# Patient Record
Sex: Female | Born: 1990 | Race: Black or African American | Hispanic: No | Marital: Single | Smoking: Never smoker
Health system: Southern US, Community
[De-identification: ages and names within clinical notes are randomized; demographics above are authoritative.]

## PROBLEM LIST (undated history)

## (undated) DIAGNOSIS — I10 Essential (primary) hypertension: Secondary | ICD-10-CM

---
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
## 2010-03-27 ENCOUNTER — Emergency Department (HOSPITAL_COMMUNITY)
Admission: EM | Admit: 2010-03-27 | Discharge: 2010-03-27 | Payer: Self-pay | Source: Home / Self Care | Admitting: Family Medicine

## 2010-08-22 ENCOUNTER — Emergency Department (HOSPITAL_COMMUNITY): Admission: EM | Admit: 2010-08-22 | Discharge: 2010-08-23 | Payer: Self-pay | Admitting: Emergency Medicine

## 2010-09-24 IMAGING — CR DG ELBOW COMPLETE 3+V*L*
4 series · 4 of 4 positions shown · non-contrast
Comparison: None.

CLINICAL DATA: Fall, pain.

LEFT ELBOW - COMPLETE 3+ VIEW

[x elbow joint ap left *]
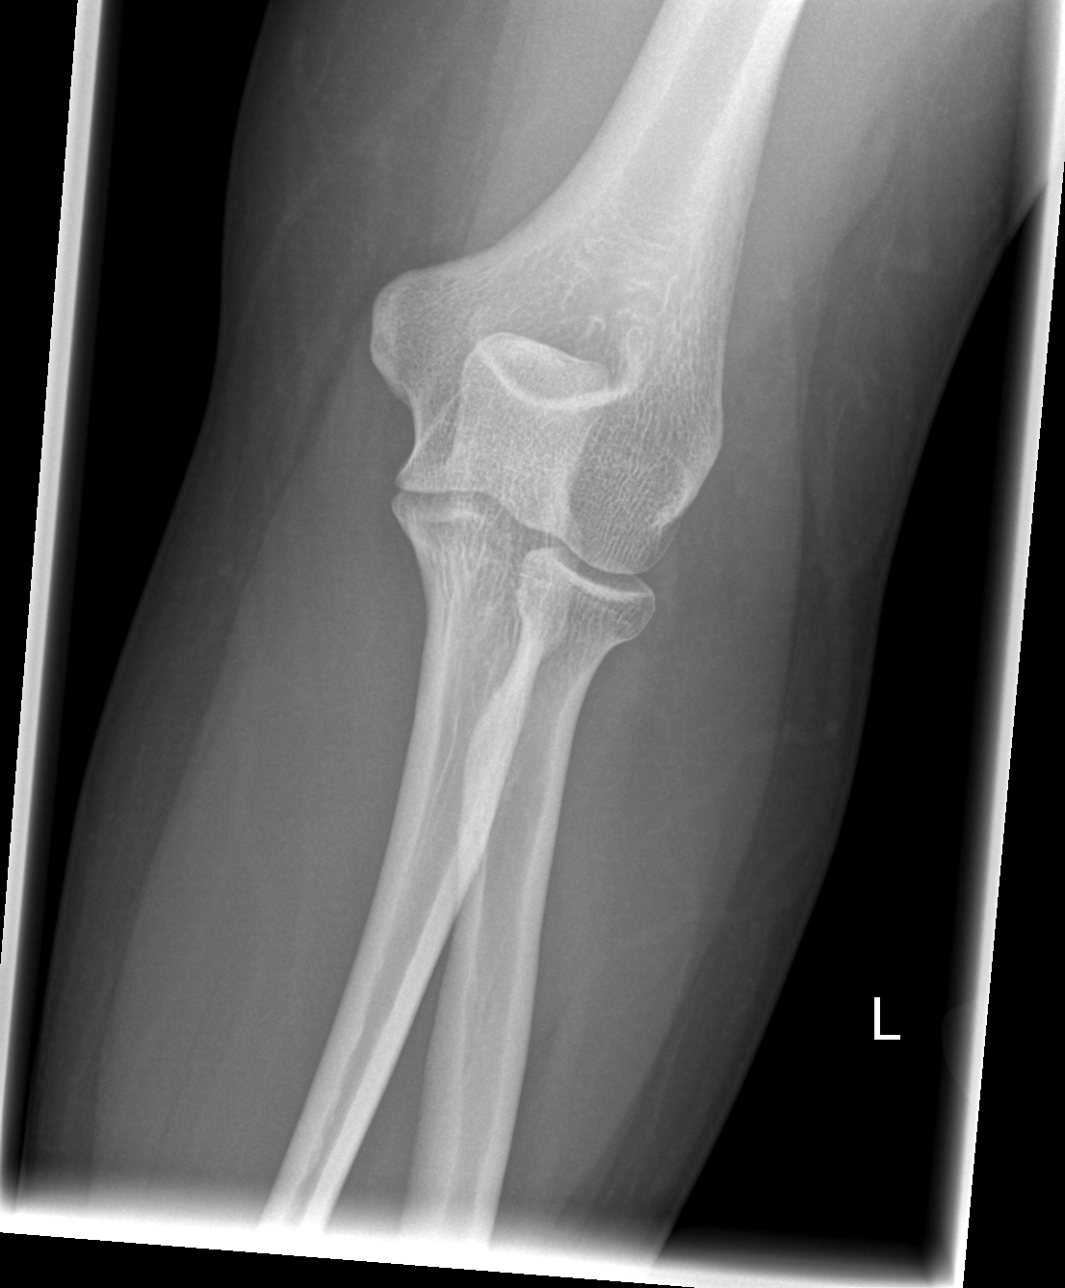

[x elbow joint obl. left * (1 of 2)]
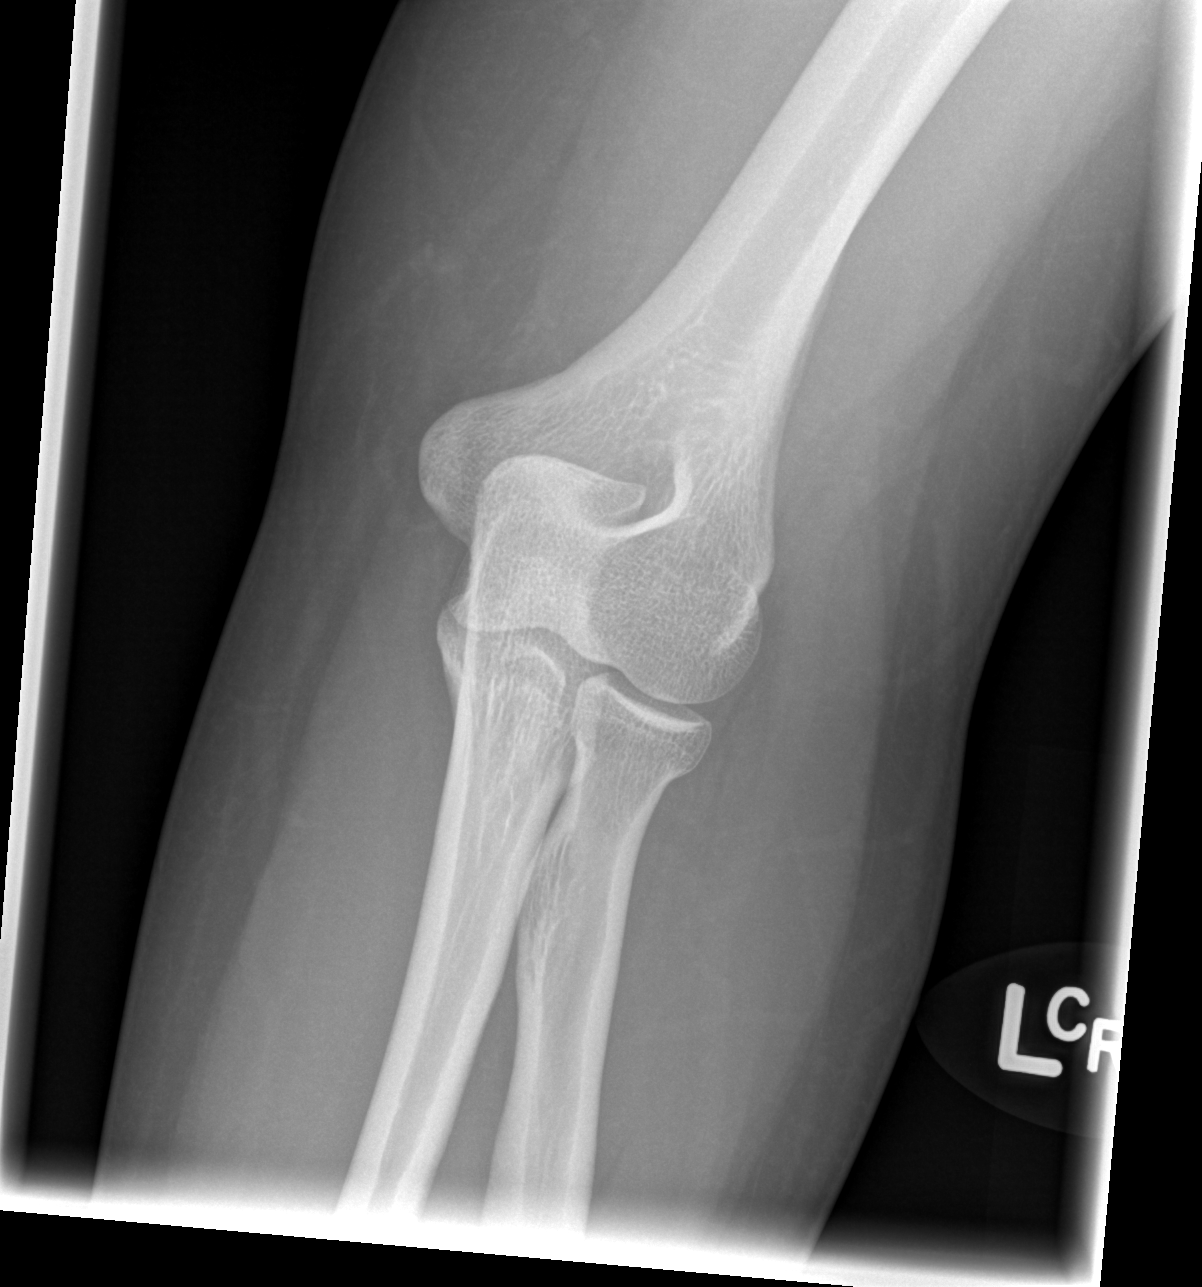

[x elbow joint obl. left * (2 of 2)]
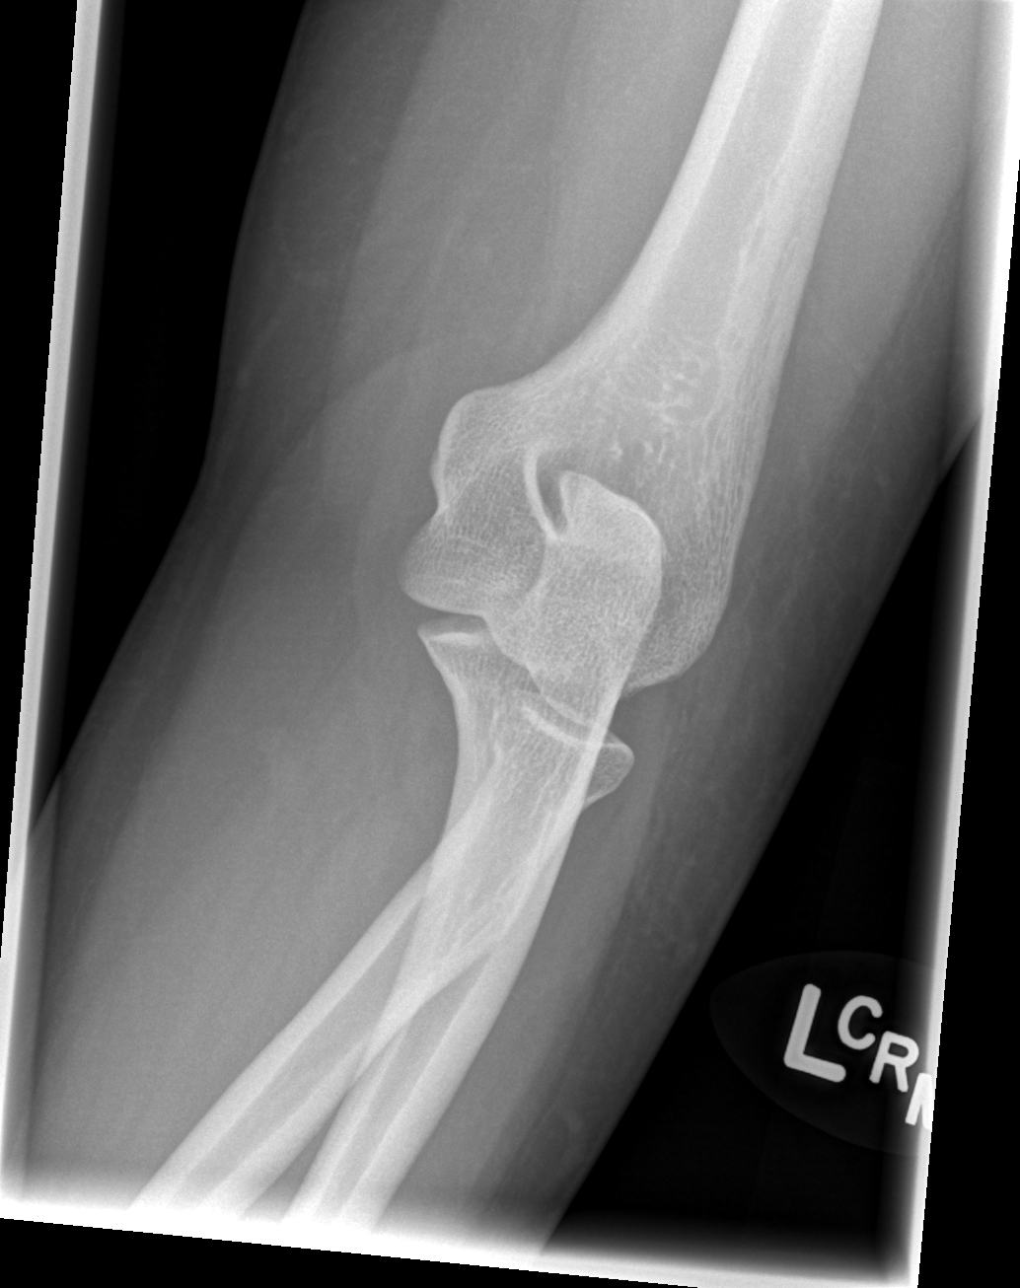

[x elbow joint lat left *]
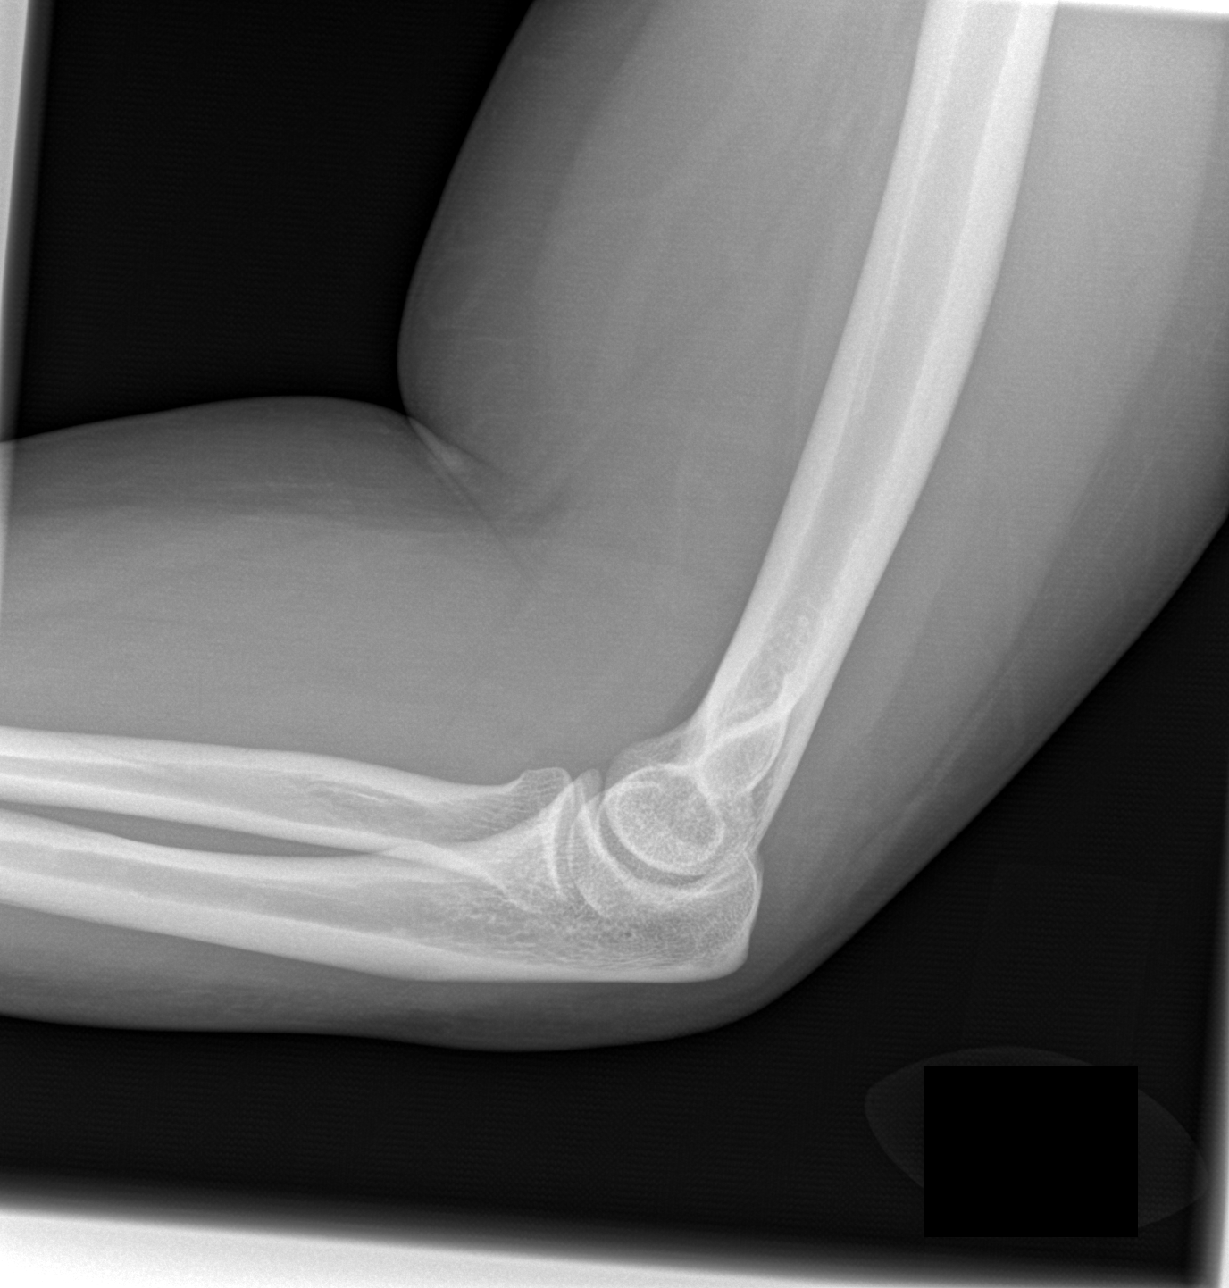

[4 of 4 positions shown; findings below may reference images not displayed]

FINDINGS: Imaged bones, joints and soft tissues appear normal.
IMPRESSION: Negative exam.

## 2011-01-09 ENCOUNTER — Emergency Department (HOSPITAL_COMMUNITY)
Admission: EM | Admit: 2011-01-09 | Discharge: 2011-01-09 | Payer: Self-pay | Source: Home / Self Care | Admitting: Emergency Medicine

## 2011-01-30 ENCOUNTER — Inpatient Hospital Stay (HOSPITAL_COMMUNITY)
Admission: AD | Admit: 2011-01-30 | Discharge: 2011-01-30 | Disposition: A | Discharge status: Home / Self Care | Payer: Medicaid Other | Source: Ambulatory Visit | Attending: Obstetrics | Admitting: Obstetrics

## 2011-01-30 ENCOUNTER — Inpatient Hospital Stay (HOSPITAL_COMMUNITY): Payer: Medicaid Other

## 2011-01-30 DIAGNOSIS — N838 Other noninflammatory disorders of ovary, fallopian tube and broad ligament: Secondary | ICD-10-CM | POA: Insufficient documentation

## 2011-01-30 DIAGNOSIS — N898 Other specified noninflammatory disorders of vagina: Secondary | ICD-10-CM

## 2011-01-30 DIAGNOSIS — N949 Unspecified condition associated with female genital organs and menstrual cycle: Secondary | ICD-10-CM | POA: Insufficient documentation

## 2011-01-30 DIAGNOSIS — Z30431 Encounter for routine checking of intrauterine contraceptive device: Secondary | ICD-10-CM | POA: Insufficient documentation

## 2011-01-30 LAB — URINALYSIS, ROUTINE W REFLEX MICROSCOPIC
Ketones, ur: NEGATIVE mg/dL
Leukocytes, UA: NEGATIVE
Nitrite: NEGATIVE
Protein, ur: NEGATIVE mg/dL

## 2011-01-30 LAB — WET PREP, GENITAL: Trich, Wet Prep: NONE SEEN

## 2011-01-30 LAB — URINE MICROSCOPIC-ADD ON

## 2011-01-31 LAB — GC/CHLAMYDIA PROBE AMP, GENITAL: Chlamydia, DNA Probe: NEGATIVE

## 2011-03-08 ENCOUNTER — Other Ambulatory Visit (HOSPITAL_COMMUNITY): Payer: Self-pay | Admitting: Obstetrics

## 2011-03-08 DIAGNOSIS — O3680X0 Pregnancy with inconclusive fetal viability, not applicable or unspecified: Secondary | ICD-10-CM

## 2011-03-08 LAB — POCT I-STAT, CHEM 8
Calcium, Ion: 1.23 mmol/L (ref 1.12–1.32)
Chloride: 103 mEq/L (ref 96–112)
Glucose, Bld: 83 mg/dL (ref 70–99)
HCT: 37 % (ref 36.0–46.0)

## 2011-03-17 ENCOUNTER — Ambulatory Visit (HOSPITAL_COMMUNITY)
Admission: RE | Admit: 2011-03-17 | Discharge: 2011-03-17 | Disposition: A | Discharge status: Home / Self Care | Payer: Medicaid Other | Source: Ambulatory Visit | Attending: Obstetrics | Admitting: Obstetrics

## 2011-03-17 DIAGNOSIS — O3680X0 Pregnancy with inconclusive fetal viability, not applicable or unspecified: Secondary | ICD-10-CM

## 2011-03-17 DIAGNOSIS — Z3689 Encounter for other specified antenatal screening: Secondary | ICD-10-CM | POA: Insufficient documentation

## 2011-03-22 LAB — POCT URINALYSIS DIP (DEVICE)
Hgb urine dipstick: NEGATIVE
Protein, ur: NEGATIVE mg/dL
Specific Gravity, Urine: 1.02 (ref 1.005–1.030)
Urobilinogen, UA: 0.2 mg/dL (ref 0.0–1.0)
pH: 5 (ref 5.0–8.0)

## 2011-03-22 LAB — WET PREP, GENITAL: Yeast Wet Prep HPF POC: NONE SEEN

## 2011-03-22 LAB — POCT PREGNANCY, URINE: Preg Test, Ur: NEGATIVE

## 2011-03-22 LAB — GC/CHLAMYDIA PROBE AMP, GENITAL
Chlamydia, DNA Probe: NEGATIVE
GC Probe Amp, Genital: NEGATIVE

## 2011-03-24 LAB — DIFFERENTIAL
Eosinophils Absolute: 0.2 10*3/uL (ref 0.0–0.7)
Eosinophils Relative: 1 % (ref 0–5)
Lymphocytes Relative: 18 % (ref 12–46)
Lymphocytes Relative: 20 % (ref 12–46)
Lymphs Abs: 2.1 10*3/uL (ref 0.7–4.0)
Lymphs Abs: 2.5 10*3/uL (ref 0.7–4.0)
Monocytes Absolute: 0.8 10*3/uL (ref 0.1–1.0)
Monocytes Relative: 5 % (ref 3–12)
Monocytes Relative: 6 % (ref 3–12)
Neutro Abs: 8.4 10*3/uL — ABNORMAL HIGH (ref 1.7–7.7)
Neutrophils Relative %: 73 % (ref 43–77)

## 2011-03-24 LAB — CBC
HCT: 38.8 % (ref 36.0–46.0)
Hemoglobin: 12.9 g/dL (ref 12.0–15.0)
MCV: 82.3 fL (ref 78.0–100.0)
Platelets: 222 10*3/uL (ref 150–400)
RBC: 4.15 MIL/uL (ref 3.87–5.11)
WBC: 11.5 10*3/uL — ABNORMAL HIGH (ref 4.0–10.5)
WBC: 12.8 10*3/uL — ABNORMAL HIGH (ref 4.0–10.5)

## 2011-03-24 LAB — POCT I-STAT, CHEM 8
BUN: 5 mg/dL — ABNORMAL LOW (ref 6–23)
BUN: 8 mg/dL (ref 6–23)
Calcium, Ion: 1.19 mmol/L (ref 1.12–1.32)
Calcium, Ion: 1.22 mmol/L (ref 1.12–1.32)
Creatinine, Ser: 0.9 mg/dL (ref 0.4–1.2)
Glucose, Bld: 88 mg/dL (ref 70–99)
Glucose, Bld: 91 mg/dL (ref 70–99)
HCT: 35 % — ABNORMAL LOW (ref 36.0–46.0)
Hemoglobin: 14.3 g/dL (ref 12.0–15.0)
Sodium: 142 mEq/L (ref 135–145)
TCO2: 28 mmol/L (ref 0–100)
TCO2: 32 mmol/L (ref 0–100)

## 2011-03-24 LAB — URINE MICROSCOPIC-ADD ON

## 2011-03-24 LAB — URINALYSIS, ROUTINE W REFLEX MICROSCOPIC
Glucose, UA: NEGATIVE mg/dL
Specific Gravity, Urine: 1.025 (ref 1.005–1.030)

## 2011-03-24 LAB — GLUCOSE, CAPILLARY

## 2011-03-24 LAB — POCT PREGNANCY, URINE: Preg Test, Ur: NEGATIVE

## 2011-03-28 LAB — POCT INFECTIOUS MONO SCREEN: Mono Screen: NEGATIVE

## 2011-04-03 LAB — CBC
HCT: 35.7 % — ABNORMAL LOW (ref 36.0–49.0)
HCT: 32.8 % — ABNORMAL LOW (ref 36.0–46.0)
Hemoglobin: 11.8 g/dL — ABNORMAL LOW (ref 12.0–16.0)
Hemoglobin: 10.3 g/dL — ABNORMAL LOW (ref 12.0–15.0)
MCHC: 33.1 g/dL (ref 30.0–36.0)
MCV: 86.3 fL (ref 78.0–100.0)
RBC: 4.1 MIL/uL (ref 3.80–5.70)
RBC: 3.56 MIL/uL — ABNORMAL LOW (ref 3.87–5.11)
RBC: 3.8 MIL/uL — ABNORMAL LOW (ref 3.87–5.11)
RDW: 14.2 % (ref 11.4–15.5)
WBC: 16.7 10*3/uL — ABNORMAL HIGH (ref 4.5–13.5)
WBC: 13.4 10*3/uL — ABNORMAL HIGH (ref 4.0–10.5)
WBC: 15.6 10*3/uL — ABNORMAL HIGH (ref 4.0–10.5)

## 2011-04-03 LAB — URINALYSIS, ROUTINE W REFLEX MICROSCOPIC
Bilirubin Urine: NEGATIVE
Bilirubin Urine: NEGATIVE
Glucose, UA: NEGATIVE mg/dL
Glucose, UA: NEGATIVE mg/dL
Hgb urine dipstick: NEGATIVE
Ketones, ur: NEGATIVE mg/dL
Ketones, ur: NEGATIVE mg/dL
Nitrite: NEGATIVE
Protein, ur: 30 mg/dL — AB
Protein, ur: 100 mg/dL — AB
Protein, ur: >300 mg/dL — AB
Urobilinogen, UA: 0.2 mg/dL (ref 0.0–1.0)

## 2011-04-03 LAB — COMPREHENSIVE METABOLIC PANEL
ALT: 16 U/L (ref 0–35)
Alkaline Phosphatase: 111 U/L (ref 47–119)
BUN: 7 mg/dL (ref 6–23)
CO2: 24 mEq/L (ref 19–32)
CO2: 26 mEq/L (ref 19–32)
Chloride: 103 mEq/L (ref 96–112)
Chloride: 107 mEq/L (ref 96–112)
Creatinine, Ser: 0.6 mg/dL (ref 0.4–1.2)
GFR calc non Af Amer: >60 mL/min (ref >60)
Glucose, Bld: 94 mg/dL (ref 70–99)
Glucose, Bld: 85 mg/dL (ref 70–99)
Potassium: 3.7 mEq/L (ref 3.5–5.1)
Sodium: 137 mEq/L (ref 135–145)
Total Bilirubin: 0.4 mg/dL (ref 0.3–1.2)
Total Bilirubin: 0.3 mg/dL (ref 0.3–1.2)
Total Protein: 6.4 g/dL (ref 6.0–8.3)

## 2011-04-03 LAB — DIFFERENTIAL
Basophils Absolute: 0 10*3/uL (ref 0.0–0.1)
Basophils Relative: 0 % (ref 0–1)
Eosinophils Absolute: 0 10*3/uL (ref 0.0–1.2)
Monocytes Relative: 3 % (ref 3–11)
Neutrophils Relative %: 91 % — ABNORMAL HIGH (ref 43–71)

## 2011-04-03 LAB — CREATININE CLEARANCE, URINE, 24 HOUR
Collection Interval-CRCL: 24 hours
Creatinine Clearance: 161 mL/min — ABNORMAL HIGH (ref 75–115)
Creatinine, 24H Ur: 1392 mg/d (ref 700–1800)
Creatinine, Urine: 132.6 mg/dL
Creatinine: 0.6 mg/dL (ref 0.40–1.20)

## 2011-04-03 LAB — URINE CULTURE: Special Requests: POSITIVE

## 2011-04-03 LAB — URIC ACID: Uric Acid, Serum: 6.1 mg/dL (ref 2.4–7.0)

## 2011-04-03 LAB — URINE MICROSCOPIC-ADD ON

## 2011-04-04 LAB — COMPREHENSIVE METABOLIC PANEL
ALT: 16 U/L (ref 0–35)
ALT: 19 U/L (ref 0–35)
ALT: 19 U/L (ref 0–35)
AST: 23 U/L (ref 0–37)
AST: 29 U/L (ref 0–37)
Albumin: 1.8 g/dL — ABNORMAL LOW (ref 3.5–5.2)
Alkaline Phosphatase: 106 U/L (ref 39–117)
Alkaline Phosphatase: 109 U/L (ref 39–117)
Alkaline Phosphatase: 111 U/L (ref 39–117)
Alkaline Phosphatase: 112 U/L (ref 39–117)
Alkaline Phosphatase: 118 U/L — ABNORMAL HIGH (ref 39–117)
Alkaline Phosphatase: 93 U/L (ref 39–117)
BUN: 3 mg/dL — ABNORMAL LOW (ref 6–23)
BUN: 5 mg/dL — ABNORMAL LOW (ref 6–23)
BUN: 5 mg/dL — ABNORMAL LOW (ref 6–23)
CO2: 24 mEq/L (ref 19–32)
CO2: 26 mEq/L (ref 19–32)
CO2: 30 mEq/L (ref 19–32)
Calcium: 8.6 mg/dL (ref 8.4–10.5)
Calcium: 8.7 mg/dL (ref 8.4–10.5)
Chloride: 102 mEq/L (ref 96–112)
Chloride: 105 mEq/L (ref 96–112)
Creatinine, Ser: 0.65 mg/dL (ref 0.4–1.2)
GFR calc Af Amer: >60 mL/min (ref >60)
GFR calc Af Amer: >60 mL/min (ref >60)
GFR calc non Af Amer: >60 mL/min (ref >60)
GFR calc non Af Amer: >60 mL/min (ref >60)
GFR calc non Af Amer: >60 mL/min (ref >60)
GFR calc non Af Amer: >60 mL/min (ref >60)
Glucose, Bld: 105 mg/dL — ABNORMAL HIGH (ref 70–99)
Glucose, Bld: 107 mg/dL — ABNORMAL HIGH (ref 70–99)
Glucose, Bld: 75 mg/dL (ref 70–99)
Glucose, Bld: 85 mg/dL (ref 70–99)
Potassium: 3.5 mEq/L (ref 3.5–5.1)
Potassium: 3.6 mEq/L (ref 3.5–5.1)
Potassium: 3.8 mEq/L (ref 3.5–5.1)
Potassium: 3.8 mEq/L (ref 3.5–5.1)
Potassium: 3.9 mEq/L (ref 3.5–5.1)
Sodium: 136 mEq/L (ref 135–145)
Sodium: 136 mEq/L (ref 135–145)
Sodium: 137 mEq/L (ref 135–145)
Total Bilirubin: 0.3 mg/dL (ref 0.3–1.2)
Total Bilirubin: 0.3 mg/dL (ref 0.3–1.2)
Total Protein: 4.1 g/dL — ABNORMAL LOW (ref 6.0–8.3)
Total Protein: 4.6 g/dL — ABNORMAL LOW (ref 6.0–8.3)
Total Protein: 4.6 g/dL — ABNORMAL LOW (ref 6.0–8.3)
Total Protein: 4.8 g/dL — ABNORMAL LOW (ref 6.0–8.3)

## 2011-04-04 LAB — CBC
HCT: 30.5 % — ABNORMAL LOW (ref 36.0–46.0)
HCT: 31.1 % — ABNORMAL LOW (ref 36.0–46.0)
HCT: 31.1 % — ABNORMAL LOW (ref 36.0–46.0)
Hemoglobin: 10.3 g/dL — ABNORMAL LOW (ref 12.0–15.0)
Hemoglobin: 10.3 g/dL — ABNORMAL LOW (ref 12.0–15.0)
Hemoglobin: 10.5 g/dL — ABNORMAL LOW (ref 12.0–15.0)
Hemoglobin: 10.7 g/dL — ABNORMAL LOW (ref 12.0–15.0)
Hemoglobin: 11.4 g/dL — ABNORMAL LOW (ref 12.0–15.0)
Hemoglobin: 12.8 g/dL (ref 12.0–15.0)
MCHC: 33.3 g/dL (ref 30.0–36.0)
MCHC: 33.5 g/dL (ref 30.0–36.0)
MCHC: 33.7 g/dL (ref 30.0–36.0)
MCHC: 34.6 g/dL (ref 30.0–36.0)
MCV: 87.3 fL (ref 78.0–100.0)
Platelets: 134 10*3/uL — ABNORMAL LOW (ref 150–400)
Platelets: 145 10*3/uL — ABNORMAL LOW (ref 150–400)
RBC: 3.53 MIL/uL — ABNORMAL LOW (ref 3.87–5.11)
RBC: 3.58 MIL/uL — ABNORMAL LOW (ref 3.87–5.11)
RBC: 3.91 MIL/uL (ref 3.87–5.11)
RDW: 15.3 % (ref 11.5–15.5)
RDW: 15.3 % (ref 11.5–15.5)
RDW: 15.3 % (ref 11.5–15.5)
RDW: 15.5 % (ref 11.5–15.5)
RDW: 15.7 % — ABNORMAL HIGH (ref 11.5–15.5)
WBC: 18.6 10*3/uL — ABNORMAL HIGH (ref 4.0–10.5)

## 2011-04-04 LAB — URIC ACID
Uric Acid, Serum: 6.1 mg/dL (ref 2.4–7.0)
Uric Acid, Serum: 6.9 mg/dL (ref 2.4–7.0)
Uric Acid, Serum: 7.5 mg/dL — ABNORMAL HIGH (ref 2.4–7.0)

## 2011-04-04 LAB — LACTATE DEHYDROGENASE
LDH: 154 U/L (ref 94–250)
LDH: 166 U/L (ref 94–250)
LDH: 202 U/L (ref 94–250)

## 2011-04-19 IMAGING — US US OB COMP LESS 14 WK
1 series · 14 of 26 positions shown · non-contrast
Comparison: none

[Series 1: us ob comp less 14 wks · 14 of 26 slices shown]
[im 1/26]
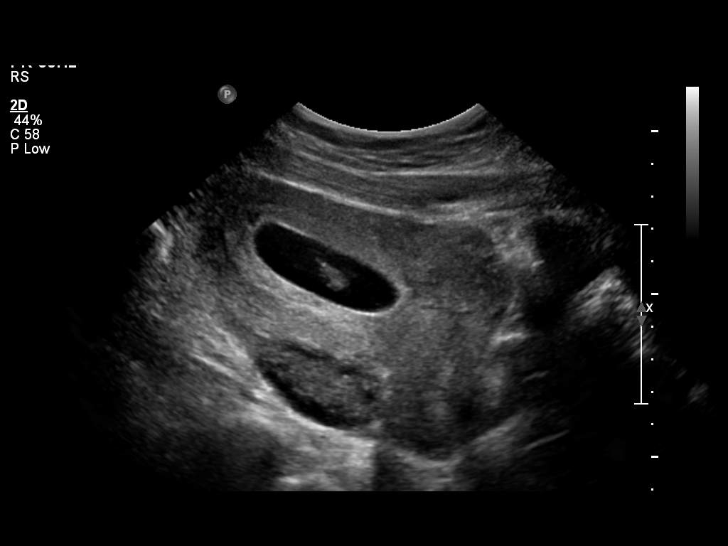
[im 3/26]
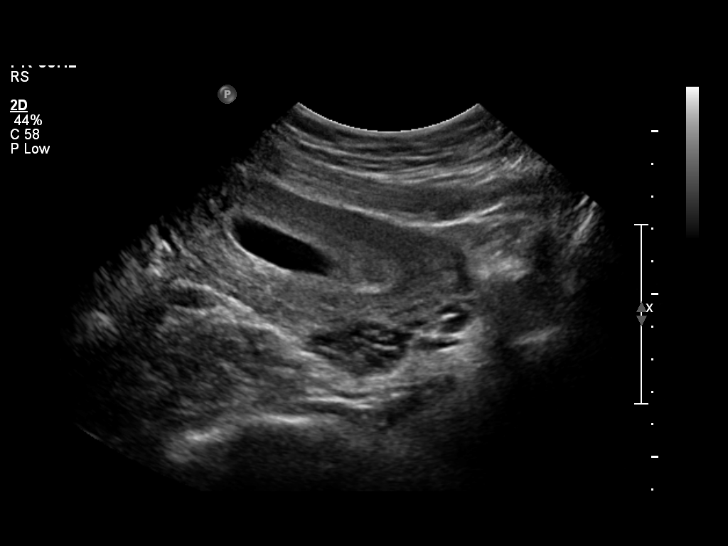
[im 5/26]
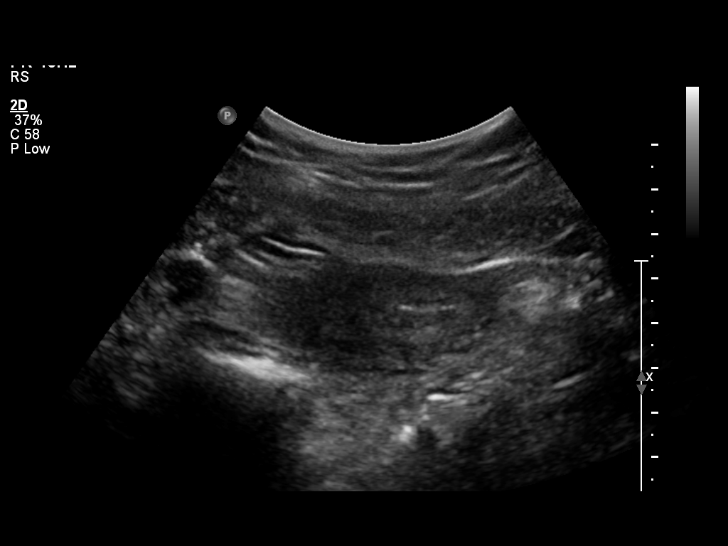
[im 7/26]
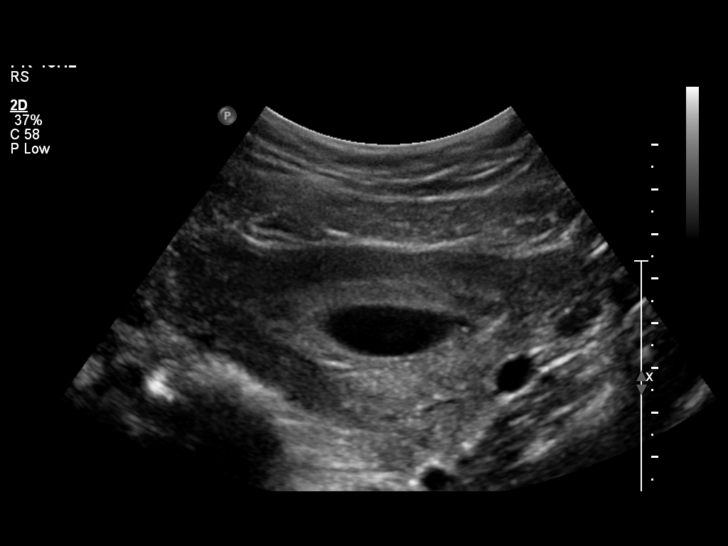
[im 9/26]
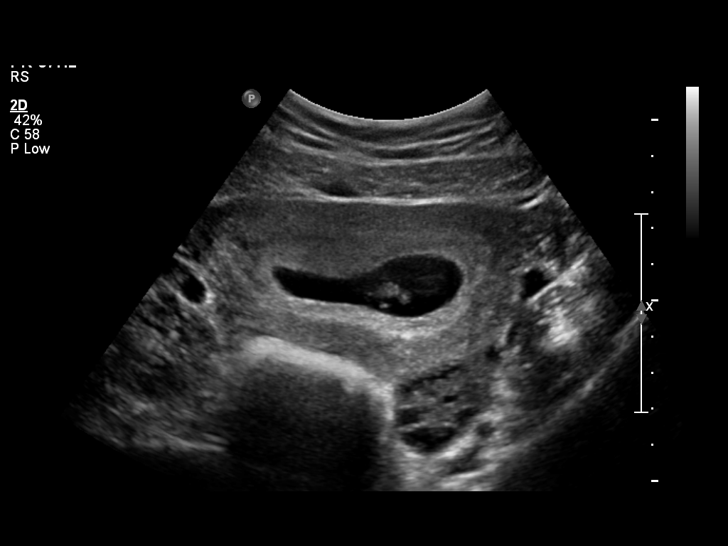
[im 11/26]
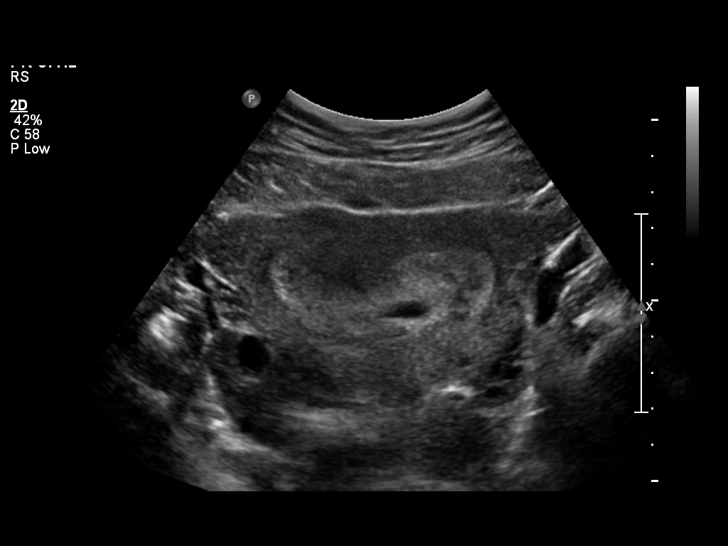
[im 13/26]
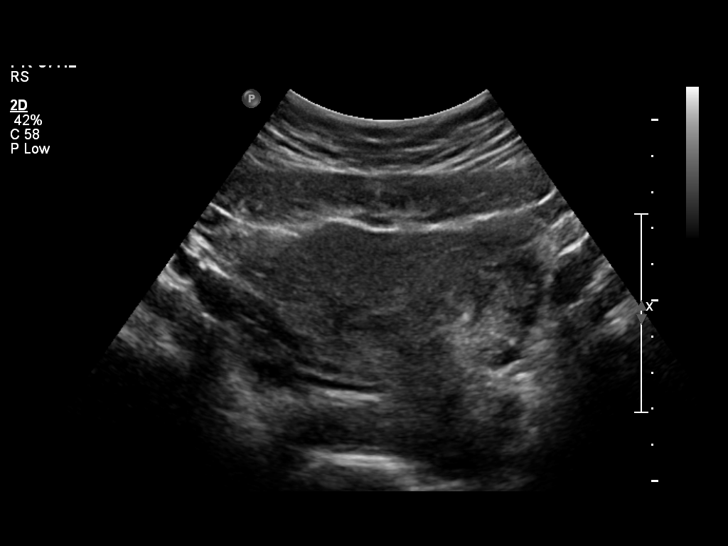
[im 14/26]
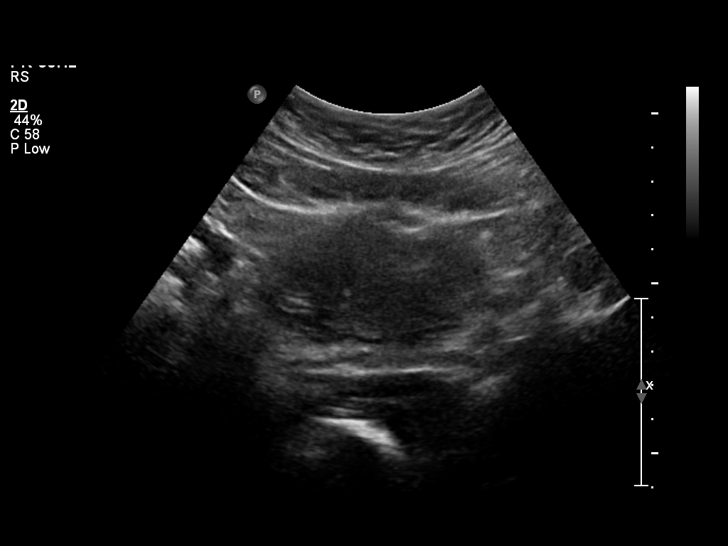
[im 16/26]
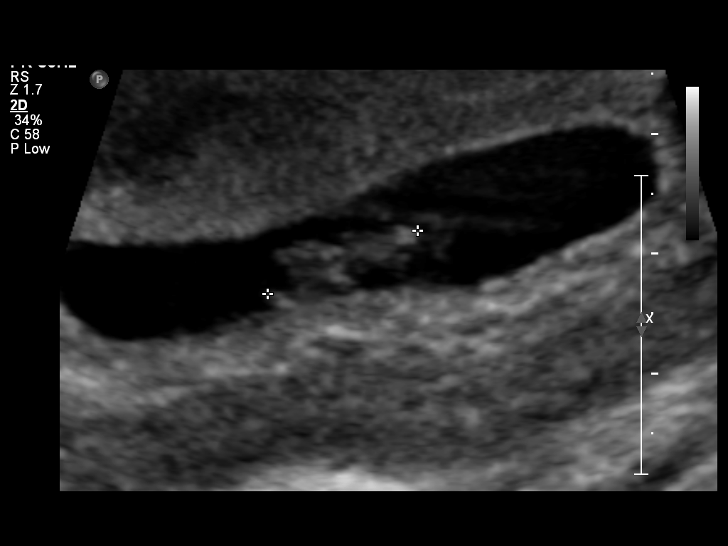
[im 18/26]
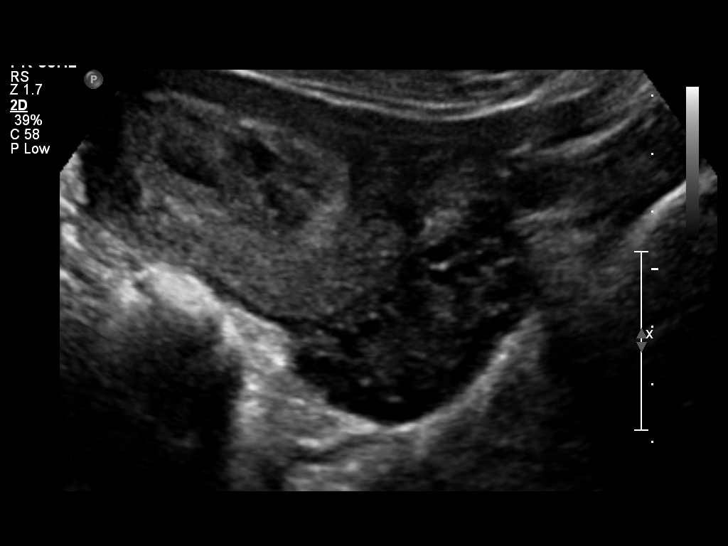
[im 20/26]
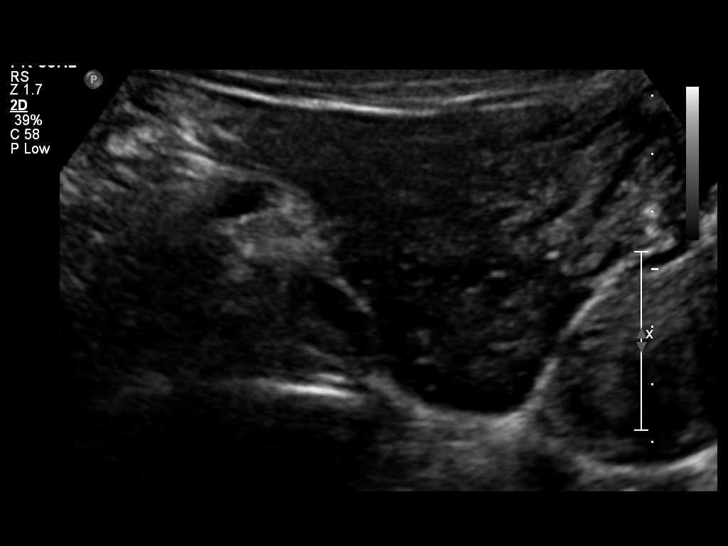
[im 22/26]
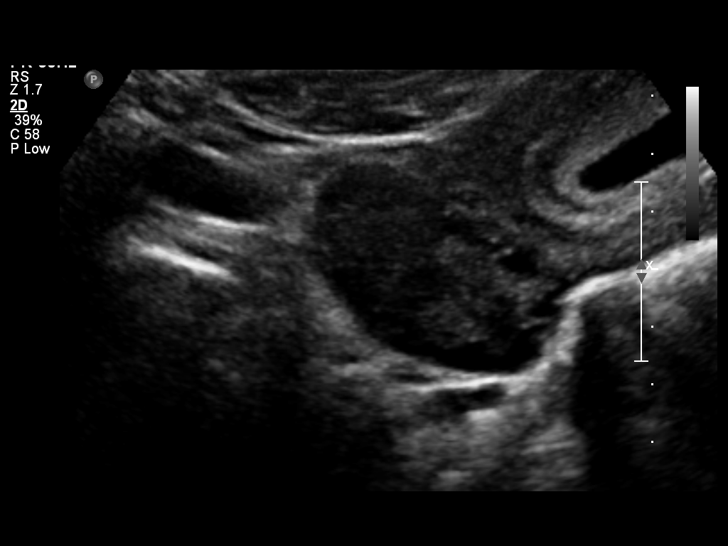
[im 24/26]
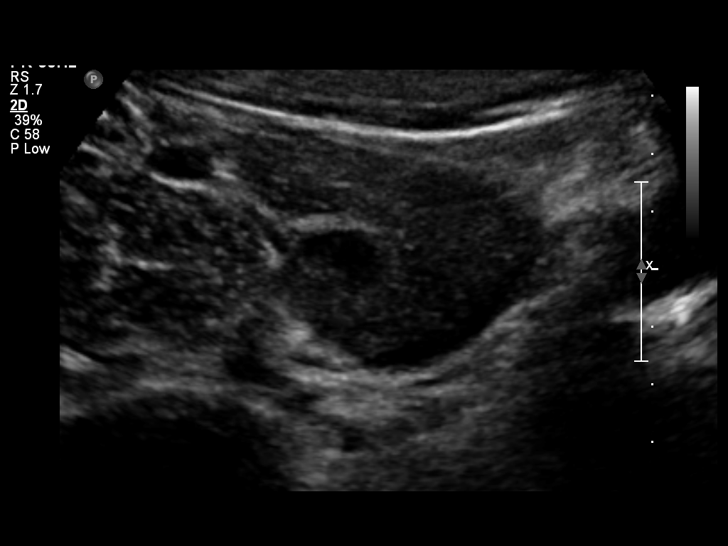
[im 26/26]
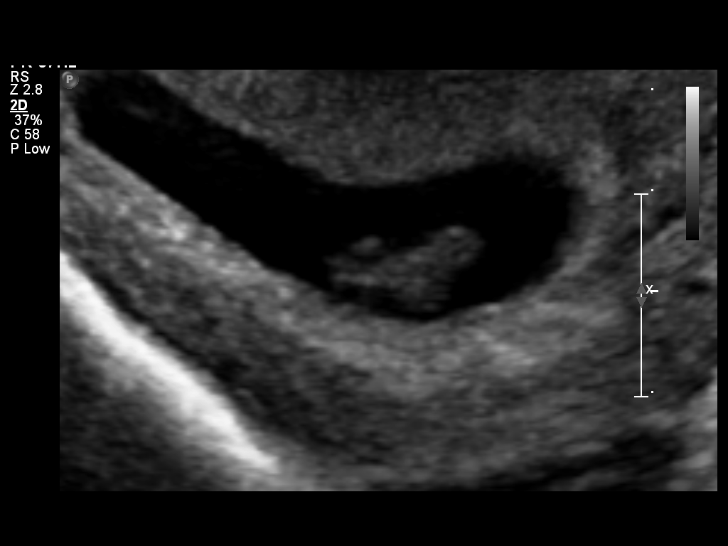

[14 of 26 positions shown; findings below may reference images not displayed]

OBSTETRICS REPORT
                      (Signed Final 03/17/2011 [DATE])

 Order#:         [id number]
Procedures

 US OB COMP LESS 14 WKS                                76801.0
Indications

 Unsure of LMP;  Establish Gestational [AGE]
 Conceived with IUD
Fetal Evaluation

 Fetal Heart Rate:  161                          bpm
 Cardiac Activity:  Observed

 Amniotic Fluid
 AFI FV:      Subjectively within normal limits
Biometry

 CRL:     13.9  mm     G. Age:  7w 5d                  EDD:    10/29/11
Gestational Age

 Best:          7w 5d      Det. By:  U/S C R L (03/17/11)     EDD:   10/29/11
Cervix Uterus Adnexa

 Cervix:       Normal appearance by transabdominal scan.

 Left Ovary:    Within normal limits measuring 4.3 x 2.5 x 3.4 cm.
 Right Ovary:   Within normal limits measuring 3.8 x 4.8 x 2.8 cm.
 Adnexa:     No abnormality visualized.
Impression

 There is a single living intrauterine pregancy demonstrating
 an EGA by CRL of  7w 5d. Normal ovaries.

## 2011-05-02 NOTE — H&P (Signed)
Amber Castro, Amber Castro             ACCOUNT NO.:  [id number]   MEDICAL RECORD NO.:  [id number]          PATIENT TYPE:  INP   LOCATION:  9157                          FACILITY:  [facility]   PHYSICIAN:  Roseanna Rainbow, M.D.DATE OF BIRTH:  [birth year]   DATE OF ADMISSION:  01/14/2009  DATE OF DISCHARGE:                              HISTORY & PHYSICAL   CHIEF COMPLAINT:  The patient is an 20 year old para 0 with an estimated  date of confinement of  March 14, 2009 with an intrauterine pregnancy at  31+ weeks who had presented to the office earlier today, and was found  to have elevated blood pressures   HISTORY OF THE PRESENT ILLNESS:  The patient presented for a routine  visit and was found to have elevated blood pressures in the 160s over  110 range and 3+ protein on a urine dip.  She had a mild headache.  No  other complaints.   SOCIAL HISTORY:  The patient is single.  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  She denies any tobacco, alcohol or other substance  abuse.   ALLERGIES:  THE PATIENT HAS AN ALLERGY TO LATEX.   PAST GYNECOLOGIC HISTORY:  Normal triad.  There is a history of  Chlamydia and trichomoniasis.   PAST MEDICAL HISTORY:  There is a history of bronchitis.   PAST SURGICAL HISTORY:  Past surgical history she denies.   FAMILY HISTORY:  The patient denies family history.   HISTORY OF PRESENT PREGNANCY:  Prenatal care was with Dr. Gaynell Face.  Onset of care was at 12 weeks.  Risk factors include a history of THC  use, positive Chlamydia and Trichomonas.   PRENATAL LABORATORY DATA:  Hemoglobin 12.6, hematocrit 37.6 and  platelets 269,000.  Blood type AB positive, antibody screen negative.  Sickle cell trait negative.  RPR nonreactive.  Rubella immune.  Hepatitis B surface antigen negative.  HIV nonreactive.  GC probe  negative.  Chlamydia probe was initially positive with a negative test  of cure.  Quad screen was negative.  One-hour GTT was 101.  Ultrasound  on  September 23,, 2009 showed an anterior placenta, breech, 14 weeks  with an [facility] of March 12, 2009.   REVIEW OF SYSTEMS:  NEUROLOGICAL:  Mild headache.  No visual  disturbances.  CARDIOVASCULAR:  She does complain of facial swelling,  and lower extremity and hand swelling.  GASTROINTESTINAL:  She denies  any epigastric pain, nausea or vomiting.  PULMONARY:  She denies any  shortness of breath.   PHYSICAL EXAMINATION:  VITAL SIGNS:  On physical exam blood pressures  range from the 130s over 80s, to 150s over  110s.  GENERAL APPEARANCE:  In general she is in no apparent distress.  HEENT:  Head, eyes, ears, nose, and throat show that there is facial  edema appreciated.  ABDOMEN:  The abdomen gravid and nontender.  VAGINAL EXAMINATION:  Sterile vaginal exam was deferred.  Fetal heart  tracing was reassuring.  Tocodynamometer showed no uterine contractions.   LABORATORY DATA:  Hemoglobin 11 and platelets 181,000.  Creatinine 0.6.  SGOT and SGPT  were normal.  Uric acid 6.1.  LDH 153.  Urinalysis showed  it was a poor specimen with many epithelial cells; however, the specific  gravity was greater than 1.030, greater than 300 protein, small  leukocyte esterase, WBCs 21-50 the urine, RBCs 11-20, and Trichomonas.   ASSESSMENT:  1. Intrauterine pregnancy at 31 plus weeks with likely preeclampsia.      The patient has no significant neurological complaints at this      point.  2. Trichomoniasis.  3. R/O cervicitis.   PLAN:  1. Admission.  2. Steroids.  3. We will start p.o. labetalol.  4. We will obtain a maternal fetal medicine consult along with a      complete obstetrical ultrasound for growth, heightened maternal      surveillance including daily weights, measure I&O.  5. We will also treat for possible cervicitis and the trichomoniasis      with Suprax, Zithromax and Flagyl.  6. We will also check a the urine culture and sensitivity.      Roseanna Rainbow, M.D.   Electronically Signed     LAJ/MEDQ  D:  01/14/2009  T:  01/15/2009  Job:  [id number]

## 2011-05-05 NOTE — Discharge Summary (Signed)
[name], Amber Castro             ACCOUNT NO.:  [id number]   MEDICAL RECORD NO.:  [id number]          PATIENT TYPE:  INP   LOCATION:  9319                          FACILITY:  [facility]   PHYSICIAN:  Kathreen Cosier, M.D.DATE OF BIRTH:  [birth year]   DATE OF ADMISSION:  01/14/2009  DATE OF DISCHARGE:  01/31/2009                               DISCHARGE SUMMARY   The patient is an 20 year old gravida 1, EDC March 28, 31 plus weeks  pregnant and was admitted to the hospital with PIH.  On admission, her  hemoglobin 12.6, platelets 269, B positive, sickle trait negative, RPR  negative, and blood pressures ranged from 130s/80s to 150/110.  She was  started on betamethasone 12.5 x2 over 24 hours.  MFM consult was  performed, and she also had a 24-hour urine protein done, which was 2037  mg in 24 hours.  She was on labetalol 300 mg p.o. b.i.d. and bedrest.  On February 4, uric acid was 6.9 up from 6.1 and diastolic was 104.  Doppler studies on February 5 were normal.  Her diastolic blood  pressures became persistent over 102 and greater and uric acid was 7.2  on February 8.  The patient discussed with MFM and it was decided she  should be induced.  She was started on magnesium sulfate and Cytotec was  inserted.  She also received labetalol 40 IV for blood pressures  160/105.  She had a normal vaginal delivery on February 9 of a female,  Apgar 8 and 8.  Postdelivery diastolics up to 117 is given labetalol 40  IV and continued on her magnesium sulfate.  Postpartum day #1,  diastolics 80s-103.  Output was greater than 100 mL/hour and by the  second postpartum, blood pressures ranged between 78-91, and magnesium  sulfate was discontinued.  She was continued on labetalol 300 every 8  hours.  By the third postpartum day, she was started on Norvasc 10 mg  p.o. daily and the labetalol was continued.  By February 14, on day #5,  diastolics 88-89, and she was discharged home on Norvasc 10 mg p.o.  daily and  labetalol 300 every 8 hours to see me in a week for blood  pressure check.   DISCHARGE DIAGNOSIS:  Status post preeclampsia 32 weeks with normal  vaginal delivery.           ______________________________  Kathreen Cosier, M.D.     BAM/MEDQ  D:  02/24/2009  T:  02/24/2009  Job:  [id number]

## 2014-07-08 ENCOUNTER — Emergency Department (INDEPENDENT_AMBULATORY_CARE_PROVIDER_SITE_OTHER)
Admission: EM | Admit: 2014-07-08 | Discharge: 2014-07-08 | Disposition: A | Discharge status: Home / Self Care | Payer: Self-pay | Source: Home / Self Care | Attending: Family Medicine | Admitting: Family Medicine

## 2014-07-08 ENCOUNTER — Encounter (HOSPITAL_COMMUNITY): Payer: Self-pay | Admitting: Emergency Medicine

## 2014-07-08 ENCOUNTER — Emergency Department (INDEPENDENT_AMBULATORY_CARE_PROVIDER_SITE_OTHER): Payer: Self-pay

## 2014-07-08 DIAGNOSIS — R000 Tachycardia, unspecified: Secondary | ICD-10-CM

## 2014-07-08 DIAGNOSIS — J069 Acute upper respiratory infection, unspecified: Secondary | ICD-10-CM

## 2014-07-08 DIAGNOSIS — I951 Orthostatic hypotension: Secondary | ICD-10-CM

## 2014-07-08 LAB — POCT URINALYSIS DIP (DEVICE)
GLUCOSE, UA: NEGATIVE mg/dL
HGB URINE DIPSTICK: NEGATIVE
Ketones, ur: NEGATIVE mg/dL
Leukocytes, UA: NEGATIVE
Nitrite: NEGATIVE
Protein, ur: 30 mg/dL — AB
UROBILINOGEN UA: 0.2 mg/dL (ref 0.0–1.0)
pH: 5.5 (ref 5.0–8.0)

## 2014-07-08 LAB — POCT PREGNANCY, URINE: PREG TEST UR: NEGATIVE

## 2014-07-08 LAB — POCT RAPID STREP A: Streptococcus, Group A Screen (Direct): NEGATIVE

## 2014-07-08 MED ORDER — PREDNISONE 10 MG PO TABS: 30.0000 mg | ORAL_TABLET | Freq: Every day | ORAL | Status: AC

## 2014-07-08 MED ORDER — IPRATROPIUM BROMIDE 0.06 % NA SOLN: 2.0000 | Freq: Four times a day (QID) | NASAL | Status: AC

## 2014-07-08 MED ORDER — TRAMADOL HCL 50 MG PO TABS: 50.0000 mg | ORAL_TABLET | Freq: Every evening | ORAL | Status: AC | PRN

## 2014-07-08 NOTE — ED Provider Notes (Signed)
Amber Castro is a 23 y.o. female who presents to Urgent Care today for fever sore throat and back pain. Symptoms present for the last 2 days. She additionally notes nasal congestion and headache. She denies any cough nausea vomiting or diarrhea. She denies any dizziness or lightheadedness. She describes a sore throat as moderate and worse with swallowing. The back pain is right-sided and worse with motion. She denies any urinary symptoms. No leg swelling.   History reviewed. No pertinent past medical history. History  Substance Use Topics  . Smoking status: Not on file  . Smokeless tobacco: Not on file  . Alcohol Use: Not on file   ROS as above Medications: No current facility-administered medications for this encounter.   Current Outpatient Prescriptions  Medication Sig Dispense Refill  . ipratropium (ATROVENT) 0.06 % nasal spray Place 2 sprays into both nostrils 4 (four) times daily.  15 mL  1  . predniSONE (DELTASONE) 10 MG tablet Take 3 tablets (30 mg total) by mouth daily.  15 tablet  0  . traMADol (ULTRAM) 50 MG tablet Take 1 tablet (50 mg total) by mouth at bedtime as needed (cough).  10 tablet  0    Exam:  BP 129/79  Pulse 119  Temp(Src) 99 F (37.2 C) (Oral)  Resp 16  SpO2 98%  LMP 06/13/2014  Orthostatic Lying - BP- Lying: 126/75 mmHg ; Pulse- Lying: 108  Orthostatic Sitting - BP- Sitting: 139/79 mmHg ; Pulse- Sitting: 114  Orthostatic Standing at 0 minutes - BP- Standing at 0 minutes: 115/75 mmHg ; Pulse- Standing at 0 minutes: 122  Gen: Well NAD nontoxic appearing HEENT: EOMI,  MMM posterior pharynx is mildly erythematous. The tympanic membranes are normal appearing bilaterally. Clear nasal discharge. Nontender maxillary sinuses bilaterally. Lungs: Normal work of breathing. CTABL Heart: Tachycardia but regular no MRG Abd: NABS, Soft. Nondistended, Nontender no CV angle tenderness to percussion Exts: Brisk capillary refill, warm and well perfused. Nonedematous  bilateral lower extremities with equal calf diameter bilateral Back: Nontender to spinal midline. Minimally tender right lumbar paraspinal. Normal neck range of motion. Reflexes and strength are intact throughout. Normal gait.  Twelve-lead EKG shows sinus tachycardia at 104 beats per minute. QTC 460. Possible left atrial large wound. No ST changes.   Results for orders placed during the hospital encounter of 07/08/14 (from the past 24 hour(s))  POCT URINALYSIS DIP (DEVICE)     Status: Abnormal   Collection Time    07/08/14 12:00 PM      Result Value Ref Range   Glucose, UA NEGATIVE  NEGATIVE mg/dL   Bilirubin Urine SMALL (*) NEGATIVE   Ketones, ur NEGATIVE  NEGATIVE mg/dL   Specific Gravity, Urine >=1.030  1.005 - 1.030   Hgb urine dipstick NEGATIVE  NEGATIVE   pH 5.5  5.0 - 8.0   Protein, ur 30 (*) NEGATIVE mg/dL   Urobilinogen, UA 0.2  0.0 - 1.0 mg/dL   Nitrite NEGATIVE  NEGATIVE   Leukocytes, UA NEGATIVE  NEGATIVE  POCT RAPID STREP A ([facility] ONLY)     Status: None   Collection Time    07/08/14 12:07 PM      Result Value Ref Range   Streptococcus, Group A Screen (Direct) NEGATIVE  NEGATIVE  POCT PREGNANCY, URINE     Status: None   Collection Time    07/08/14 12:07 PM      Result Value Ref Range   Preg Test, Ur NEGATIVE  NEGATIVE   Dg Chest  2 View  07/08/2014   CLINICAL DATA:  Sore throat, low-grade fever, congestion  EXAM: CHEST  2 VIEW  COMPARISON:  None.  FINDINGS: The lungs are clear and negative for focal airspace consolidation, pulmonary edema or suspicious pulmonary nodule. No pleural effusion or pneumothorax. Cardiac and mediastinal contours are within normal limits. No acute fracture or lytic or blastic osseous lesions. The visualized upper abdominal bowel gas pattern is unremarkable.  IMPRESSION: No active cardiopulmonary disease.   Electronically Signed   By: Malachy Moan M.D.   On: 07/08/2014 12:57    Assessment and Plan: 23 y.o. female with viral URI with  pharyngitis. Most likely explanation. Patient's only concerning physical exam finding is sinus tachycardia with mild orthostatic changes. Doubtful for serious bacterial infection. Doubtful for pulmonary embolism as well. Plan for watchful waiting with treatment with prednisone Atrovent nasal spray tramadol for cough.  Discussed warning signs or symptoms. Please see discharge instructions. Patient expresses understanding.   This note was created using Conservation officer, historic buildings. Any transcription errors are unintended.    Rodolph Bong, MD 07/08/14 1344

## 2014-07-08 NOTE — Discharge Instructions (Signed)
Thank you for coming in today. °Call or go to the emergency room if you get worse, have trouble breathing, have chest pains, or palpitations.  ° ° °Upper Respiratory Infection, Adult °An upper respiratory infection (URI) is also sometimes known as the common cold. The upper respiratory tract includes the nose, sinuses, throat, trachea, and bronchi. Bronchi are the airways leading to the lungs. Most people improve within 1 week, but symptoms can last up to 2 weeks. A residual cough may last even longer.  °CAUSES °Many different viruses can infect the tissues lining the upper respiratory tract. The tissues become irritated and inflamed and often become very moist. Mucus production is also common. A cold is contagious. You can easily spread the virus to others by oral contact. This includes kissing, sharing a glass, coughing, or sneezing. Touching your mouth or nose and then touching a surface, which is then touched by another person, can also spread the virus. °SYMPTOMS  °Symptoms typically develop 1 to 3 days after you come in contact with a cold virus. Symptoms vary from person to person. They may include: °· Runny nose. °· Sneezing. °· Nasal congestion. °· Sinus irritation. °· Sore throat. °· Loss of voice (laryngitis). °· Cough. °· Fatigue. °· Muscle aches. °· Loss of appetite. °· Headache. °· Low-grade fever. °DIAGNOSIS  °You might diagnose your own cold based on familiar symptoms, since most people get a cold 2 to 3 times a year. Your caregiver can confirm this based on your exam. Most importantly, your caregiver can check that your symptoms are not due to another disease such as strep throat, sinusitis, pneumonia, asthma, or epiglottitis. Blood tests, throat tests, and X-rays are not necessary to diagnose a common cold, but they may sometimes be helpful in excluding other more serious diseases. Your caregiver will decide if any further tests are required. °RISKS AND COMPLICATIONS  °You may be at risk for a more  severe case of the common cold if you smoke cigarettes, have chronic heart disease (such as heart failure) or lung disease (such as asthma), or if you have a weakened immune system. The very young and very old are also at risk for more serious infections. Bacterial sinusitis, middle ear infections, and bacterial pneumonia can complicate the common cold. The common cold can worsen asthma and chronic obstructive pulmonary disease (COPD). Sometimes, these complications can require emergency medical care and may be life-threatening. °PREVENTION  °The best way to protect against getting a cold is to practice good hygiene. Avoid oral or hand contact with people with cold symptoms. Wash your hands often if contact occurs. There is no clear evidence that vitamin C, vitamin E, echinacea, or exercise reduces the chance of developing a cold. However, it is always recommended to get plenty of rest and practice good nutrition. °TREATMENT  °Treatment is directed at relieving symptoms. There is no cure. Antibiotics are not effective, because the infection is caused by a virus, not by bacteria. Treatment may include: °· Increased fluid intake. Sports drinks offer valuable electrolytes, sugars, and fluids. °· Breathing heated mist or steam (vaporizer or shower). °· Eating chicken soup or other clear broths, and maintaining good nutrition. °· Getting plenty of rest. °· Using gargles or lozenges for comfort. °· Controlling fevers with ibuprofen or acetaminophen as directed by your caregiver. °· Increasing usage of your inhaler if you have asthma. °Zinc gel and zinc lozenges, taken in the first 24 hours of the common cold, can shorten the duration and lessen the severity   of symptoms. Pain medicines may help with fever, muscle aches, and throat pain. A variety of non-prescription medicines are available to treat congestion and runny nose. Your caregiver can make recommendations and may suggest nasal or lung inhalers for other symptoms.    HOME CARE INSTRUCTIONS   Only take over-the-counter or prescription medicines for pain, discomfort, or fever as directed by your caregiver.  Use a warm mist humidifier or inhale steam from a shower to increase air moisture. This may keep secretions moist and make it easier to breathe.  Drink enough water and fluids to keep your urine clear or pale yellow.  Rest as needed.  Return to work when your temperature has returned to normal or as your caregiver advises. You may need to stay home longer to avoid infecting others. You can also use a face mask and careful hand washing to prevent spread of the virus. SEEK MEDICAL CARE IF:   After the first few days, you feel you are getting worse rather than better.  You need your caregiver's advice about medicines to control symptoms.  You develop chills, worsening shortness of breath, or brown or red sputum. These may be signs of pneumonia.  You develop yellow or brown nasal discharge or pain in the face, especially when you bend forward. These may be signs of sinusitis.  You develop a fever, swollen neck glands, pain with swallowing, or white areas in the back of your throat. These may be signs of strep throat. SEEK IMMEDIATE MEDICAL CARE IF:   You have a fever.  You develop severe or persistent headache, ear pain, sinus pain, or chest pain.  You develop wheezing, a prolonged cough, cough up blood, or have a change in your usual mucus (if you have chronic lung disease).  You develop sore muscles or a stiff neck. Document Released: 05/30/2001 Document Revised: 02/26/2012 Document Reviewed: 04/07/2011 [facility] Patient Information 2015 [place], [place]. This information is not intended to replace advice given to you by your health care provider. Make sure you discuss any questions you have with your health care provider.   Orthostatic Hypotension Orthostatic hypotension is a sudden drop in blood pressure. It happens when you quickly stand up  from a seated or lying position. You may feel dizzy or light-headed. This can last for just a few seconds or for up to a few minutes. It is usually not a serious problem. However, if this happens frequently or gets worse, it can be a sign of something more serious. CAUSES  Different things can cause orthostatic hypotension, including:   Loss of body fluids (dehydration).  Medicines that lower blood pressure.  Sudden changes in posture, such as standing up quickly after you have been sitting or lying down.  Taking too much of your medicine. SIGNS AND SYMPTOMS   Light-headedness or dizziness.   Fainting or near-fainting.   A fast heart rate.   Weakness.   Feeling tired (fatigue).  DIAGNOSIS  Your health care provider may do several things to help diagnose your condition and identify the cause. These may include:   Taking a medical history and doing a physical exam.  Checking your blood pressure. Your health care provider will check your blood pressure when you are:  Lying down.  Sitting.  Standing.  Using tilt table testing. In this test, you lie down on a table that moves from a lying position to a standing position. You will be strapped onto the table. This test monitors your blood pressure and heart  rate when you are in different positions. TREATMENT  Treatment will vary depending on the cause. Possible treatments include:   Changing the dosage of your medicines.  Wearing compression stockings on your lower legs.  Standing up slowly after sitting or lying down.  Eating more salt.  Eating frequent, small meals.  In some cases, getting IV fluids.  Taking medicine to enhance fluid retention. HOME CARE INSTRUCTIONS  Only take over-the-counter or prescription medicines as directed by your health care provider.  Follow your health care provider's instructions for changing the dosage of your current medicines.  Do not stop or adjust your medicine on your  own.  Stand up slowly after sitting or lying down. This allows your body to adjust to the different position.  Wear compression stockings as directed.  Eat extra salt as directed.  Do not add extra salt to your diet unless directed to by your health care provider.  Eat frequent, small meals.  Avoid standing suddenly after eating.  Avoid hot showers or excessive heat as directed by your health care provider.  Keep all follow-up appointments. SEEK MEDICAL CARE IF:  You continue to feel dizzy or light-headed after standing.  You feel groggy or confused.  You feel cold, clammy, or sick to your stomach (nauseous).  You have blurred vision.  You feel short of breath. SEEK IMMEDIATE MEDICAL CARE IF:   You faint after standing.  You have chest pain.  You have difficulty breathing.   You lose feeling or movement in your arms or legs.   You have slurred speech or difficulty talking, or you are unable to talk.  MAKE SURE YOU:   Understand these instructions.  Will watch your condition.  Will get help right away if you are not doing well or get worse. Document Released: 11/24/2002 Document Revised: 12/09/2013 Document Reviewed: 09/26/2013 [facility] Patient Information 2015 [place]. This information is not intended to replace advice given to you by your health care provider. Make sure you discuss any questions you have with your health care provider.

## 2014-07-10 LAB — CULTURE, GROUP A STREP

## 2014-07-12 ENCOUNTER — Telehealth (HOSPITAL_COMMUNITY): Payer: Self-pay | Admitting: Family Medicine

## 2014-07-12 MED ORDER — AMOXICILLIN 500 MG PO CAPS: 500.0000 mg | ORAL_CAPSULE | Freq: Three times a day (TID) | ORAL | Status: DC

## 2014-07-12 MED ORDER — AMOXICILLIN 500 MG PO CAPS: 500.0000 mg | ORAL_CAPSULE | Freq: Three times a day (TID) | ORAL | Status: AC

## 2014-07-12 NOTE — ED Notes (Signed)
Pt called back.  Amoxicillin sent in to [facility] on [address]  Amber BongEvan S Jacquelyn Antony, MD 07/12/14 1106

## 2014-07-12 NOTE — ED Notes (Signed)
Strep culture is positive. Tried to call both numbers. Unable to contact pt or leave a message.   Amoxicillin sent in to pharmamcy.  RN to attempt to contact pt.   Rodolph BongEvan S Corey, MD 07/12/14 [phone]

## 2014-07-13 ENCOUNTER — Telehealth (HOSPITAL_COMMUNITY): Payer: Self-pay | Admitting: *Deleted

## 2014-07-15 NOTE — ED Notes (Signed)
Unable to reach pt. by phone x 3. Confidential marked [place] [facility]. After sending letter I called [facility], to inform them I could not contact pt. and they said pt. picked up the Rx. of Amoxicillin on the 26th. Vassie MoselleYork, Sarafina Puthoff M 07/15/2014

## 2024-09-22 ENCOUNTER — Emergency Department (HOSPITAL_COMMUNITY)
Admission: EM | Admit: 2024-09-22 | Discharge: 2024-09-23 | Disposition: A | Discharge status: Home / Self Care | Source: Home / Self Care | Attending: Emergency Medicine | Admitting: Emergency Medicine

## 2024-09-22 ENCOUNTER — Emergency Department (HOSPITAL_COMMUNITY)

## 2024-09-22 ENCOUNTER — Encounter (HOSPITAL_COMMUNITY): Payer: Self-pay | Admitting: Emergency Medicine

## 2024-09-22 ENCOUNTER — Other Ambulatory Visit: Payer: Self-pay

## 2024-09-22 ENCOUNTER — Emergency Department (HOSPITAL_COMMUNITY)
Admission: EM | Admit: 2024-09-22 | Discharge: 2024-09-22 | Discharge status: Against Medical Advice | Attending: Emergency Medicine | Admitting: Emergency Medicine

## 2024-09-22 DIAGNOSIS — N83201 Unspecified ovarian cyst, right side: Secondary | ICD-10-CM | POA: Insufficient documentation

## 2024-09-22 DIAGNOSIS — I10 Essential (primary) hypertension: Secondary | ICD-10-CM | POA: Insufficient documentation

## 2024-09-22 DIAGNOSIS — N939 Abnormal uterine and vaginal bleeding, unspecified: Secondary | ICD-10-CM | POA: Diagnosis present

## 2024-09-22 DIAGNOSIS — R600 Localized edema: Secondary | ICD-10-CM | POA: Diagnosis not present

## 2024-09-22 DIAGNOSIS — M79672 Pain in left foot: Secondary | ICD-10-CM | POA: Insufficient documentation

## 2024-09-22 DIAGNOSIS — Z5321 Procedure and treatment not carried out due to patient leaving prior to being seen by health care provider: Secondary | ICD-10-CM | POA: Diagnosis not present

## 2024-09-22 DIAGNOSIS — N83202 Unspecified ovarian cyst, left side: Secondary | ICD-10-CM | POA: Insufficient documentation

## 2024-09-22 DIAGNOSIS — R102 Pelvic and perineal pain unspecified side: Secondary | ICD-10-CM

## 2024-09-22 HISTORY — DX: Essential (primary) hypertension: I10

## 2024-09-22 LAB — CBC WITH DIFFERENTIAL/PLATELET
Abs Immature Granulocytes: 0.07 K/uL (ref 0.00–0.07)
Basophils Absolute: 0.1 K/uL (ref 0.0–0.1)
Basophils Relative: 0 %
Eosinophils Absolute: 0.1 K/uL (ref 0.0–0.5)
Eosinophils Relative: 1 %
HCT: 37.7 % (ref 36.0–46.0)
Hemoglobin: 11.8 g/dL — ABNORMAL LOW (ref 12.0–15.0)
Immature Granulocytes: 0 %
Lymphocytes Relative: 22 %
Lymphs Abs: 3.9 K/uL (ref 0.7–4.0)
MCH: 25.9 pg — ABNORMAL LOW (ref 26.0–34.0)
MCHC: 31.3 g/dL (ref 30.0–36.0)
MCV: 82.9 fL (ref 80.0–100.0)
Monocytes Absolute: 1.4 K/uL — ABNORMAL HIGH (ref 0.1–1.0)
Monocytes Relative: 8 %
Neutro Abs: 11.9 K/uL — ABNORMAL HIGH (ref 1.7–7.7)
Neutrophils Relative %: 69 %
Platelets: 337 K/uL (ref 150–400)
RBC: 4.55 MIL/uL (ref 3.87–5.11)
RDW: 15.7 % — ABNORMAL HIGH (ref 11.5–15.5)
WBC: 17.4 K/uL — ABNORMAL HIGH (ref 4.0–10.5)
nRBC: 0 % (ref 0.0–0.2)

## 2024-09-22 LAB — URINALYSIS, ROUTINE W REFLEX MICROSCOPIC
Bilirubin Urine: NEGATIVE
Glucose, UA: NEGATIVE mg/dL
Ketones, ur: NEGATIVE mg/dL
Leukocytes,Ua: NEGATIVE
Nitrite: NEGATIVE
Protein, ur: NEGATIVE mg/dL
Specific Gravity, Urine: 1.02 (ref 1.005–1.030)
pH: 5 (ref 5.0–8.0)

## 2024-09-22 LAB — COMPREHENSIVE METABOLIC PANEL WITH GFR
ALT: 10 U/L (ref 0–44)
AST: 16 U/L (ref 15–41)
Albumin: 3.5 g/dL (ref 3.5–5.0)
Alkaline Phosphatase: 57 U/L (ref 38–126)
Anion gap: 8 (ref 5–15)
BUN: 11 mg/dL (ref 6–20)
CO2: 27 mmol/L (ref 22–32)
Calcium: 9.3 mg/dL (ref 8.9–10.3)
Chloride: 105 mmol/L (ref 98–111)
Creatinine, Ser: 0.91 mg/dL (ref 0.44–1.00)
GFR, Estimated: >60 mL/min (ref >60)
Glucose, Bld: 76 mg/dL (ref 70–99)
Potassium: 3.5 mmol/L (ref 3.5–5.1)
Sodium: 140 mmol/L (ref 135–145)
Total Bilirubin: 0.4 mg/dL (ref 0.0–1.2)
Total Protein: 7.4 g/dL (ref 6.5–8.1)

## 2024-09-22 LAB — HCG, SERUM, QUALITATIVE: Preg, Serum: NEGATIVE

## 2024-09-22 MED ORDER — HYDROCODONE-ACETAMINOPHEN 5-325 MG PO TABS
2.0000 | ORAL_TABLET | Freq: Once | ORAL | Status: AC
  Administered 2024-09-22: 2 via ORAL
  Filled 2024-09-22: qty 2

## 2024-09-22 NOTE — ED Triage Notes (Signed)
 Foot swelling and pain relates to having to work on it all the time and it significantly impats her mobility.    Bleeding and cramping is intermittent but extended from her normal period.  Pt is experiencing some dizziness.  States the 1st day her last menstrual cycle was 09/04/24. Bleeding issue has been happening since she started. Pt has a birth control implant since 12/22.

## 2024-09-22 NOTE — ED Triage Notes (Signed)
 Patient had to re-check in for same complaint after being discharged incorrectly.

## 2024-09-22 NOTE — ED Provider Triage Note (Signed)
 Emergency Medicine Provider Triage Evaluation Note  Delania Ferg , a 33 y.o. female  was evaluated in triage.  Pt complains of abnormal vaginal bleeding, first day of last menstrual cycle was 18 September, began having heavy bleeding starting 3 weeks prior, denies having any large clots passed.  Also complains of increased pedal edema bilaterally, standing for long hours during the week for work, has pain along the soles of both feet..  Review of Systems  Positive: As above Negative:   Physical Exam  BP (!) 148/99   Pulse 80   Temp 98.3 F (36.8 C)   Resp 14   Ht 4' 11.5 (1.511 m)   Wt 98 kg   SpO2 100%   BMI 42.90 kg/m  Gen:   Awake, no distress   Resp:  Normal effort  MSK:   Moves extremities without difficulty  Other:    Medical Decision Making  Medically screening exam initiated at 3:50 PM.  Appropriate orders placed.  Myron Hakimi was informed that the remainder of the evaluation will be completed by another provider, this initial triage assessment does not replace that evaluation, and the importance of remaining in the ED until their evaluation is complete.  Initial order set placed for abnormal uterine bleeding.  Also assess CMP with GFR to evaluate renal status secondary to bilateral pedal edema.   Myriam Dorn [place], [place] 09/22/24 [phone]

## 2024-09-22 NOTE — ED Notes (Signed)
 Called pt x3 for room, no response.

## 2024-09-22 NOTE — ED Triage Notes (Signed)
 Patient in ED with concerns of vaginal bleeding for the past 3 weeks. She states that she s had some lower abdominal pain with the bleeding. Denies passing any clots. Also patient has concerns for foot swelling that started 5 months ago and is getting worse.

## 2024-09-23 ENCOUNTER — Emergency Department (HOSPITAL_COMMUNITY)

## 2024-09-23 DIAGNOSIS — N939 Abnormal uterine and vaginal bleeding, unspecified: Secondary | ICD-10-CM | POA: Diagnosis not present

## 2024-09-23 LAB — GC/CHLAMYDIA PROBE AMP (~~LOC~~) NOT AT ARMC
Chlamydia: NEGATIVE
Comment: NEGATIVE
Comment: NORMAL
Neisseria Gonorrhea: NEGATIVE

## 2024-09-23 LAB — WET PREP, GENITAL
Sperm: NONE SEEN
Trich, Wet Prep: NONE SEEN
WBC, Wet Prep HPF POC: <10 (ref <10)
Yeast Wet Prep HPF POC: NONE SEEN

## 2024-09-23 LAB — HIV ANTIBODY (ROUTINE TESTING W REFLEX): HIV Screen 4th Generation wRfx: NONREACTIVE

## 2024-09-23 MED ORDER — ONDANSETRON HCL 4 MG/2ML IJ SOLN
4.0000 mg | Freq: Once | INTRAMUSCULAR | Status: AC
  Administered 2024-09-23: 4 mg via INTRAVENOUS
  Filled 2024-09-23: qty 2

## 2024-09-23 MED ORDER — IOHEXOL 350 MG/ML SOLN
75.0000 mL | Freq: Once | INTRAVENOUS | Status: AC | PRN
  Administered 2024-09-23: 75 mL via INTRAVENOUS

## 2024-09-23 MED ORDER — MORPHINE SULFATE (PF) 4 MG/ML IV SOLN
4.0000 mg | Freq: Once | INTRAVENOUS | Status: AC
  Administered 2024-09-23: 4 mg via INTRAVENOUS
  Filled 2024-09-23: qty 1

## 2024-09-23 MED ORDER — NAPROXEN 500 MG PO TABS: 500.0000 mg | ORAL_TABLET | Freq: Two times a day (BID) | ORAL | 0 refills | Status: AC

## 2024-09-23 NOTE — Discharge Instructions (Signed)
 You were seen today for dysfunctional uterine bleeding and abdominal pain.  Your workup today is reassuring.  Follow back up with your OB/GYN.  Given that you have an Implanon, sometimes hormonal fluctuations with this implant can begin to have some dysfunctional uterine bleeding.  You do have bilateral ovarian cysts and will need repeat ultrasound imaging in 1 year.  Your x-rays are negative for fracture or acute abnormality of your foot.  Take naproxen daily for your pain.

## 2024-09-23 NOTE — ED Notes (Signed)
 Patient transported to X-ray

## 2024-09-23 NOTE — ED Provider Notes (Signed)
 [place] EMERGENCY DEPARTMENT AT [facility] Provider Note   CSN: [id number] Arrival date & time: 09/22/24  2256     Patient presents with: Vaginal Bleeding and Ankle Pain   Amber Castro is a 33 y.o. female.   HPI     This is a 33 year old female who presents with dysfunctional and irregular uterine bleeding.  Patient reports her last menstrual period was September 18.  She had a normal 3 to 4-day duration.  She did have some prodromal abdominal discomfort which her significant other states that kept her in bed which is abnormal.  She states that 2 to 3 days after her cycle she began to rebleed as if it were normal..  She reports that she did have negative pregnancy testing during that time.  She also saw her OB/GYN who did not do any testing but told me to wait it out.  She since has had some ongoing irregular bleeding and intermittent lower abdominal pain.  States that the pain gets so intense that she cannot get out of bed.  Denies dysuria or fevers.  She has had an Implanon for approximately 3 years.  Never had issues with dysfunctional bleeding in the past.  Denies any concerns for STDs.  No vaginal discharge.  No new sexual partners.  Of note, also complaining of left foot pain.  Reports that she works 6 days a week standing on her feet and has had left foot pain for the last 5 months or so.  No noted injury.  Prior to Admission medications   Medication Sig Start Date End Date Taking? Authorizing Provider  naproxen (NAPROSYN) 500 MG tablet Take 1 tablet (500 mg total) by mouth 2 (two) times daily. 09/23/24  Yes Tavoris Brisk, Charmaine FALCON, MD  amoxicillin  (AMOXIL ) 500 MG capsule Take 1 capsule (500 mg total) by mouth 3 (three) times daily. 07/12/14   Corey, Evan S, MD  ipratropium (ATROVENT ) 0.06 % nasal spray Place 2 sprays into both nostrils 4 (four) times daily. 07/08/14   Joane Artist RAMAN, MD  predniSONE  (DELTASONE ) 10 MG tablet Take 3 tablets (30 mg total) by mouth daily.  07/08/14   Joane Artist RAMAN, MD  traMADol  (ULTRAM ) 50 MG tablet Take 1 tablet (50 mg total) by mouth at bedtime as needed (cough). 07/08/14   Joane Artist RAMAN, MD    Allergies: Patient has no known allergies.    Review of Systems  Constitutional:  Negative for fever.  Respiratory:  Negative for shortness of breath.   Cardiovascular:  Negative for chest pain.  Gastrointestinal:  Positive for abdominal pain. Negative for nausea and vomiting.  Genitourinary:  Positive for vaginal bleeding. Negative for dysuria.  All other systems reviewed and are negative.   Updated Vital Signs BP 121/73 (BP Location: Right Arm)   Pulse 75   Temp 97.9 F (36.6 C) (Oral)   Resp 18   LMP 09/04/2024   SpO2 100%   Physical Exam Vitals and nursing note reviewed.  Constitutional:      Appearance: She is well-developed. She is obese. She is not ill-appearing.  HENT:     Head: Normocephalic and atraumatic.  Eyes:     Pupils: Pupils are equal, round, and reactive to light.  Cardiovascular:     Rate and Rhythm: Normal rate and regular rhythm.     Heart sounds: Normal heart sounds.  Pulmonary:     Effort: Pulmonary effort is normal. No respiratory distress.     Breath sounds: No wheezing.  Abdominal:     General: Bowel sounds are normal.     Palpations: Abdomen is soft.     Tenderness: There is abdominal tenderness. There is no guarding or rebound.     Comments: Suprapubic tenderness to palpation  Genitourinary:    Comments: Normal external vaginal exam, moderate vaginal discharge noted, no cervical motion tenderness or adnexal tenderness, no bleeding noted Musculoskeletal:     Cervical back: Neck supple.  Skin:    General: Skin is warm and dry.  Neurological:     Mental Status: She is alert and oriented to person, place, and time.  Psychiatric:        Mood and Affect: Mood normal.     (all labs ordered are listed, but only abnormal results are displayed) Labs Reviewed  WET PREP, GENITAL -  Abnormal; Notable for the following components:      Result Value   Clue Cells Wet Prep HPF POC PRESENT (*)    All other components within normal limits  HIV ANTIBODY (ROUTINE TESTING W REFLEX)  GC/CHLAMYDIA PROBE AMP ([place]) NOT AT [facility]    EKG: None  Radiology: CT ABDOMEN PELVIS W CONTRAST Result Date: 09/23/2024 CLINICAL DATA:  Right lower quadrant pain EXAM: CT ABDOMEN AND PELVIS WITH CONTRAST TECHNIQUE: Multidetector CT imaging of the abdomen and pelvis was performed using the standard protocol following bolus administration of intravenous contrast. RADIATION DOSE REDUCTION: This exam was performed according to the departmental dose-optimization program which includes automated exposure control, adjustment of the mA and/or kV according to patient size and/or use of iterative reconstruction technique. CONTRAST:  75mL OMNIPAQUE IOHEXOL 350 MG/ML SOLN COMPARISON:  None Available. FINDINGS: Lower chest: No acute abnormality. Hepatobiliary: No focal liver abnormality is seen. No gallstones, gallbladder wall thickening, or biliary dilatation. Pancreas: Unremarkable. No pancreatic ductal dilatation or surrounding inflammatory changes. Spleen: Normal in size without focal abnormality. Adrenals/Urinary Tract: Adrenal glands are within normal limits. Kidneys demonstrate a normal enhancement pattern. No renal calculi or obstructive changes are seen. The bladder is decompressed. Stomach/Bowel: The appendix is well visualized and within normal limits. No obstructive or inflammatory changes of the colon are seen. Small bowel and stomach are within normal limits. Vascular/Lymphatic: No significant vascular findings are present. No enlarged abdominal or pelvic lymph nodes. Reproductive: Uterus is well visualized and within normal limits. Bilateral ovarian cysts are noted. The largest of these is on the left measuring 5 cm. These are similar to that seen on ultrasound of the pelvis from the previous day.  Other: No abdominal wall hernia or abnormality. No abdominopelvic ascites. Musculoskeletal: No acute or significant osseous findings. IMPRESSION: Bilateral ovarian cysts similar to that seen on ultrasound from the previous day. No other focal abnormality is noted. Electronically Signed   By: Oneil Devonshire M.D.   On: 09/23/2024 01:58   DG Foot Complete Left Result Date: 09/23/2024 CLINICAL DATA:  pain EXAM: LEFT FOOT - COMPLETE 3+ VIEW COMPARISON:  X-ray left foot 11/04/2004 FINDINGS: There is no evidence of fracture or dislocation. Plantar calcaneal spur. There is no evidence of arthropathy or other focal bone abnormality. Soft tissues are unremarkable. IMPRESSION: No acute displaced fracture or dislocation. Electronically Signed   By: Morgane  Naveau M.D.   On: 09/23/2024 00:58     Procedures   Medications Ordered in the ED  morphine (PF) 4 MG/ML injection 4 mg (4 mg Intravenous Given 09/23/24 0057)  ondansetron (ZOFRAN) injection 4 mg (4 mg Intravenous Given 09/23/24 0057)  iohexol (OMNIPAQUE) 350 MG/ML  injection 75 mL (75 mLs Intravenous Contrast Given 09/23/24 0131)                                    Medical Decision Making Amount and/or Complexity of Data Reviewed Labs: ordered. Radiology: ordered.  Risk Prescription drug management.   This patient presents to the ED for concern of abnormal uterine bleeding, abdominal pain, foot pain, this involves an extensive number of treatment options, and is a complaint that carries with it a high risk of complications and morbidity.  I considered the following differential and admission for this acute, potentially life threatening condition.  The differential diagnosis includes menorrhagia, pregnancy complication such as miscarriage, dysfunctional uterine bleeding, UTI, STD  MDM:    This is a 33 year old female who presents with concerns for abnormal uterine bleeding.  She toxic.  Vital signs are reassuring.  She is not anemic.  She has some  lower suprapubic tenderness to palpation that does not lateralize.  Ultrasound imaging reviewed and shows no evidence of ovarian torsion.  She does have bilateral ovarian cyst that need follow-up imaging in 1 year.  I discussed this with the patient.  Urinalysis is not consistent with a UTI.  She is not pregnant.  Given pain on exam, CT imaging was obtained.  No evidence of appendicitis or other abnormality.  Pelvic exam does show some moderate vaginal discharge.  Patient declines empiric STD treatment.  She does not want to wait for wet prep.  She has been in the ED for many hours at this point.  Recommend naproxen for ongoing pain and following up with her OB/GYN as dysfunctional bleeding could be related to the Implanon.  Additionally, her x-rays of her left foot are negative.  (Labs, imaging, consults)  Labs: I Ordered, and personally interpreted labs.  The pertinent results include: CBC, BMP, urinalysis, urine pregnancy  Imaging Studies ordered: I ordered imaging studies including ultrasound, CT I independently visualized and interpreted imaging. I agree with the radiologist interpretation  Additional history obtained from chart review.  External records from outside source obtained and reviewed including prior evaluations  Cardiac Monitoring: The patient was maintained on a cardiac monitor.  If on the cardiac monitor, I personally viewed and interpreted the cardiac monitored which showed an underlying rhythm of: Sinus  Reevaluation: After the interventions noted above, I reevaluated the patient and found that they have :improved  Social Determinants of Health:  lives independently  Disposition: Discharge  Co morbidities that complicate the patient evaluation  Past Medical History:  Diagnosis Date   Hypertension      Medicines Meds ordered this encounter  Medications   morphine (PF) 4 MG/ML injection 4 mg   ondansetron (ZOFRAN) injection 4 mg   iohexol (OMNIPAQUE) 350 MG/ML  injection 75 mL   naproxen (NAPROSYN) 500 MG tablet    Sig: Take 1 tablet (500 mg total) by mouth 2 (two) times daily.    Dispense:  30 tablet    Refill:  0    I have reviewed the patients home medicines and have made adjustments as needed  Problem List / ED Course: Problem List Items Addressed This Visit   None Visit Diagnoses       Abnormal uterine bleeding    -  Primary     Pelvic pain         Bilateral ovarian cysts  Left foot pain                    Final diagnoses:  Abnormal uterine bleeding  Pelvic pain  Bilateral ovarian cysts  Left foot pain    ED Discharge Orders          Ordered    naproxen (NAPROSYN) 500 MG tablet  2 times daily        09/23/24 0307               Glender Augusta, Charmaine FALCON, MD 09/23/24 9688    Bari Charmaine FALCON, MD 09/24/24 2311

## 2024-09-23 NOTE — ED Provider Notes (Incomplete Revision)
 [place] EMERGENCY DEPARTMENT AT [facility] Provider Note   CSN: [id number] Arrival date & time: 09/22/24  2256     Patient presents with: Vaginal Bleeding and Ankle Pain   Amber Castro is a 33 y.o. female.   HPI     This is a 33 year old female who presents with dysfunctional and irregular uterine bleeding.  Patient reports her last menstrual period was September 18.  She had a normal 3 to 4-day duration.  She did have some prodromal abdominal discomfort which her significant other states that kept her in bed which is abnormal.  She states that 2 to 3 days after her cycle she began to rebleed as if it were normal..  She reports that she did have negative pregnancy testing during that time.  She also saw her OB/GYN who did not do any testing but told me to wait it out.  She since has had some ongoing irregular bleeding and intermittent lower abdominal pain.  States that the pain gets so intense that she cannot get out of bed.  Denies dysuria or fevers.  She has had an Implanon for approximately 3 years.  Never had issues with dysfunctional bleeding in the past.  Denies any concerns for STDs.  No vaginal discharge.  No new sexual partners.  Of note, also complaining of left foot pain.  Reports that she works 6 days a week standing on her feet and has had left foot pain for the last 5 months or so.  No noted injury.  Prior to Admission medications   Medication Sig Start Date End Date Taking? Authorizing Provider  naproxen (NAPROSYN) 500 MG tablet Take 1 tablet (500 mg total) by mouth 2 (two) times daily. 09/23/24  Yes Angell Honse, Charmaine FALCON, MD  amoxicillin  (AMOXIL ) 500 MG capsule Take 1 capsule (500 mg total) by mouth 3 (three) times daily. 07/12/14   Corey, Evan S, MD  ipratropium (ATROVENT ) 0.06 % nasal spray Place 2 sprays into both nostrils 4 (four) times daily. 07/08/14   Joane Artist RAMAN, MD  predniSONE  (DELTASONE ) 10 MG tablet Take 3 tablets (30 mg total) by mouth daily.  07/08/14   Joane Artist RAMAN, MD  traMADol  (ULTRAM ) 50 MG tablet Take 1 tablet (50 mg total) by mouth at bedtime as needed (cough). 07/08/14   Joane Artist RAMAN, MD    Allergies: Patient has no known allergies.    Review of Systems  Constitutional:  Negative for fever.  Respiratory:  Negative for shortness of breath.   Cardiovascular:  Negative for chest pain.  Gastrointestinal:  Positive for abdominal pain. Negative for nausea and vomiting.  Genitourinary:  Positive for vaginal bleeding. Negative for dysuria.  All other systems reviewed and are negative.   Updated Vital Signs BP 121/73 (BP Location: Right Arm)   Pulse 75   Temp 97.9 F (36.6 C) (Oral)   Resp 18   LMP 09/04/2024   SpO2 100%   Physical Exam Vitals and nursing note reviewed.  Constitutional:      Appearance: She is well-developed. She is obese. She is not ill-appearing.  HENT:     Head: Normocephalic and atraumatic.  Eyes:     Pupils: Pupils are equal, round, and reactive to light.  Cardiovascular:     Rate and Rhythm: Normal rate and regular rhythm.     Heart sounds: Normal heart sounds.  Pulmonary:     Effort: Pulmonary effort is normal. No respiratory distress.     Breath sounds: No wheezing.  Abdominal:     General: Bowel sounds are normal.     Palpations: Abdomen is soft.     Tenderness: There is abdominal tenderness. There is no guarding or rebound.     Comments: Suprapubic tenderness to palpation  Genitourinary:    Comments: Normal external vaginal exam, moderate vaginal discharge noted, no cervical motion tenderness or adnexal tenderness, no bleeding noted Musculoskeletal:     Cervical back: Neck supple.  Skin:    General: Skin is warm and dry.  Neurological:     Mental Status: She is alert and oriented to person, place, and time.  Psychiatric:        Mood and Affect: Mood normal.     (all labs ordered are listed, but only abnormal results are displayed) Labs Reviewed  WET PREP, GENITAL  HIV  ANTIBODY (ROUTINE TESTING W REFLEX)  GC/CHLAMYDIA PROBE AMP ([place]) NOT AT [facility]    EKG: None  Radiology: CT ABDOMEN PELVIS W CONTRAST Result Date: 09/23/2024 CLINICAL DATA:  Right lower quadrant pain EXAM: CT ABDOMEN AND PELVIS WITH CONTRAST TECHNIQUE: Multidetector CT imaging of the abdomen and pelvis was performed using the standard protocol following bolus administration of intravenous contrast. RADIATION DOSE REDUCTION: This exam was performed according to the departmental dose-optimization program which includes automated exposure control, adjustment of the mA and/or kV according to patient size and/or use of iterative reconstruction technique. CONTRAST:  75mL OMNIPAQUE IOHEXOL 350 MG/ML SOLN COMPARISON:  None Available. FINDINGS: Lower chest: No acute abnormality. Hepatobiliary: No focal liver abnormality is seen. No gallstones, gallbladder wall thickening, or biliary dilatation. Pancreas: Unremarkable. No pancreatic ductal dilatation or surrounding inflammatory changes. Spleen: Normal in size without focal abnormality. Adrenals/Urinary Tract: Adrenal glands are within normal limits. Kidneys demonstrate a normal enhancement pattern. No renal calculi or obstructive changes are seen. The bladder is decompressed. Stomach/Bowel: The appendix is well visualized and within normal limits. No obstructive or inflammatory changes of the colon are seen. Small bowel and stomach are within normal limits. Vascular/Lymphatic: No significant vascular findings are present. No enlarged abdominal or pelvic lymph nodes. Reproductive: Uterus is well visualized and within normal limits. Bilateral ovarian cysts are noted. The largest of these is on the left measuring 5 cm. These are similar to that seen on ultrasound of the pelvis from the previous day. Other: No abdominal wall hernia or abnormality. No abdominopelvic ascites. Musculoskeletal: No acute or significant osseous findings. IMPRESSION: Bilateral ovarian  cysts similar to that seen on ultrasound from the previous day. No other focal abnormality is noted. Electronically Signed   By: Oneil Devonshire M.D.   On: 09/23/2024 01:58   DG Foot Complete Left Result Date: 09/23/2024 CLINICAL DATA:  pain EXAM: LEFT FOOT - COMPLETE 3+ VIEW COMPARISON:  X-ray left foot 11/04/2004 FINDINGS: There is no evidence of fracture or dislocation. Plantar calcaneal spur. There is no evidence of arthropathy or other focal bone abnormality. Soft tissues are unremarkable. IMPRESSION: No acute displaced fracture or dislocation. Electronically Signed   By: Morgane  Naveau M.D.   On: 09/23/2024 00:58   US  Pelvis Complete Result Date: 09/22/2024 CLINICAL DATA:  Abnormal vaginal bleeding EXAM: TRANSABDOMINAL AND TRANSVAGINAL ULTRASOUND OF PELVIS TECHNIQUE: Both transabdominal and transvaginal ultrasound examinations of the pelvis were performed. Transabdominal technique was performed for global imaging of the pelvis including uterus, ovaries, adnexal regions, and pelvic cul-de-sac. It was necessary to proceed with endovaginal exam following the transabdominal exam to visualize the endometrium, uterus, and ovaries. COMPARISON:  January 30, 2011 FINDINGS:  Uterus Measurements: 10.3 x 5.1 x 6.7 cm = volume: 184 mL. No fibroids or other mass visualized. Endometrium Thickness: 2 mm.  No focal abnormality visualized. Right ovary Measurements: 5.6 x 4.3 x 4.8 cm = volume: 61 mL. 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 An adjacent cyst measures 3.3 x 2.3 x 2.8 cm. Left ovary Measurements: 6 x 4.7 x 5.6 cm = volume: 82 mL. Large cyst measuring 5.4 x 4.3 x 4.3 cm. Other findings Small volume free fluid in the pelvis, likely physiologic. IMPRESSION: 1. Bilateral ovarian cysts, the largest on the left measures 5.4 x 4.3 x 4.3 cm. A follow-up pelvic ultrasound in 12 months is recommended to document resolution. 2. Normal appearance of the uterus and endometrium. Electronically Signed   By:  Rogelia Myers M.D.   On: 09/22/2024 17:43     Procedures   Medications Ordered in the ED  morphine (PF) 4 MG/ML injection 4 mg (4 mg Intravenous Given 09/23/24 0057)  ondansetron (ZOFRAN) injection 4 mg (4 mg Intravenous Given 09/23/24 0057)  iohexol (OMNIPAQUE) 350 MG/ML injection 75 mL (75 mLs Intravenous Contrast Given 09/23/24 0131)                                    Medical Decision Making Amount and/or Complexity of Data Reviewed Labs: ordered. Radiology: ordered.  Risk Prescription drug management.   This patient presents to the ED for concern of abnormal uterine bleeding, abdominal pain, foot pain, this involves an extensive number of treatment options, and is a complaint that carries with it a high risk of complications and morbidity.  I considered the following differential and admission for this acute, potentially life threatening condition.  The differential diagnosis includes menorrhagia, pregnancy complication such as miscarriage, dysfunctional uterine bleeding, UTI, STD  MDM:    This is a 33 year old female who presents with concerns for abnormal uterine bleeding.  She toxic.  Vital signs are reassuring.  She is not anemic.  She has some lower suprapubic tenderness to palpation that does not lateralize.  Ultrasound imaging reviewed and shows no evidence of ovarian torsion.  She does have bilateral ovarian cyst that need follow-up imaging in 1 year.  I discussed this with the patient.  Urinalysis is not consistent with a UTI.  She is not pregnant.  Given pain on exam, CT imaging was obtained.  No evidence of appendicitis or other abnormality.  Pelvic exam does show some moderate vaginal discharge.  Patient declines empiric STD treatment.  She does not want to wait for wet prep.  She has been in the ED for many hours at this point.  Recommend naproxen for ongoing pain and following up with her OB/GYN as dysfunctional bleeding could be related to the Implanon.  Additionally,  her x-rays of her left foot are negative.  (Labs, imaging, consults)  Labs: I Ordered, and personally interpreted labs.  The pertinent results include: CBC, BMP, urinalysis, urine pregnancy  Imaging Studies ordered: I ordered imaging studies including ultrasound, CT I independently visualized and interpreted imaging. I agree with the radiologist interpretation  Additional history obtained from chart review.  External records from outside source obtained and reviewed including prior evaluations  Cardiac Monitoring: The patient was maintained on a cardiac monitor.  If on the cardiac monitor, I personally viewed and interpreted the cardiac monitored which showed an underlying rhythm of: Sinus  Reevaluation: After  the interventions noted above, I reevaluated the patient and found that they have :improved  Social Determinants of Health:  lives independently  Disposition: Discharge  Co morbidities that complicate the patient evaluation  Past Medical History:  Diagnosis Date   Hypertension      Medicines Meds ordered this encounter  Medications   morphine (PF) 4 MG/ML injection 4 mg   ondansetron (ZOFRAN) injection 4 mg   iohexol (OMNIPAQUE) 350 MG/ML injection 75 mL   naproxen (NAPROSYN) 500 MG tablet    Sig: Take 1 tablet (500 mg total) by mouth 2 (two) times daily.    Dispense:  30 tablet    Refill:  0    I have reviewed the patients home medicines and have made adjustments as needed  Problem List / ED Course: Problem List Items Addressed This Visit   None Visit Diagnoses       Abnormal uterine bleeding    -  Primary     Pelvic pain         Bilateral ovarian cysts         Left foot pain                    Final diagnoses:  Abnormal uterine bleeding  Pelvic pain  Bilateral ovarian cysts  Left foot pain    ED Discharge Orders          Ordered    naproxen (NAPROSYN) 500 MG tablet  2 times daily        09/23/24 0307               Bari Charmaine FALCON, MD 09/23/24 [phone]

## 2024-11-02 ENCOUNTER — Ambulatory Visit

## 2024-11-17 ENCOUNTER — Encounter (HOSPITAL_COMMUNITY): Payer: Self-pay

## 2024-11-17 ENCOUNTER — Other Ambulatory Visit: Payer: Self-pay

## 2024-11-17 ENCOUNTER — Emergency Department (HOSPITAL_COMMUNITY)
Admission: EM | Admit: 2024-11-17 | Discharge: 2024-11-17 | Disposition: A | Discharge status: Home / Self Care | Attending: Emergency Medicine | Admitting: Emergency Medicine

## 2024-11-17 DIAGNOSIS — I10 Essential (primary) hypertension: Secondary | ICD-10-CM | POA: Diagnosis not present

## 2024-11-17 DIAGNOSIS — Z206 Contact with and (suspected) exposure to human immunodeficiency virus [HIV]: Secondary | ICD-10-CM | POA: Insufficient documentation

## 2024-11-17 LAB — COMPREHENSIVE METABOLIC PANEL WITH GFR
ALT: 6 U/L (ref 0–44)
AST: 15 U/L (ref 15–41)
Albumin: 3.8 g/dL (ref 3.5–5.0)
Alkaline Phosphatase: 63 U/L (ref 38–126)
Anion gap: 8 (ref 5–15)
BUN: 12 mg/dL (ref 6–20)
CO2: 27 mmol/L (ref 22–32)
Calcium: 9.2 mg/dL (ref 8.9–10.3)
Chloride: 106 mmol/L (ref 98–111)
Creatinine, Ser: 0.88 mg/dL (ref 0.44–1.00)
GFR, Estimated: >60 mL/min (ref >60)
Glucose, Bld: 110 mg/dL — ABNORMAL HIGH (ref 70–99)
Potassium: 3.3 mmol/L — ABNORMAL LOW (ref 3.5–5.1)
Sodium: 140 mmol/L (ref 135–145)
Total Bilirubin: 0.3 mg/dL (ref 0.0–1.2)
Total Protein: 7 g/dL (ref 6.5–8.1)

## 2024-11-17 LAB — RAPID HIV SCREEN (HIV 1/2 AB+AG)
HIV 1/2 Antibodies: NONREACTIVE
HIV-1 P24 Antigen - HIV24: NONREACTIVE

## 2024-11-17 LAB — HEPATITIS B SURFACE ANTIGEN: Hepatitis B Surface Ag: NONREACTIVE

## 2024-11-17 LAB — HEPATITIS C ANTIBODY: HCV Ab: NONREACTIVE

## 2024-11-17 LAB — SYPHILIS: RPR W/REFLEX TO RPR TITER AND TREPONEMAL ANTIBODIES, TRADITIONAL SCREENING AND DIAGNOSIS ALGORITHM: RPR Ser Ql: NONREACTIVE

## 2024-11-17 LAB — PREGNANCY, URINE: Preg Test, Ur: NEGATIVE

## 2024-11-17 MED ORDER — BICTEGRAVIR-EMTRICITAB-TENOFOV 50-200-25 MG PO PREPACK
1.0000 | ORAL_TABLET | Freq: Every day | ORAL | Status: DC
  Administered 2024-11-17: 1 via ORAL
  Filled 2024-11-17: qty 1

## 2024-11-17 NOTE — Discharge Instructions (Addendum)
 Return to the emergency department if you develop any life-threatening symptoms.  Infectious disease should contact you to schedule an appointment for follow-up.  Please be sure to take the [facility] as prescribed.

## 2024-11-17 NOTE — ED Triage Notes (Signed)
 Pt states that her boyfriend told her that he tested positive for hiv. Pt would like to be tested for stds as well.

## 2024-11-17 NOTE — ED Provider Notes (Signed)
 [place] EMERGENCY DEPARTMENT AT [facility] Provider Note   CSN: [id number] Arrival date & time: 11/17/24  0320     Patient presents with: Exposure to STD   Amber Castro is a 33 y.o. female. Patient with past medical history significant for hypertension presents to the emergency department complaining of exposure to HIV.  Patient states that her fianc tested positive for HIV recently.  She does endorse having unprotected sex with him.  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  She denies any symptoms at this time.  She denies vaginal discharge, pain, dysuria, fever, nausea, vomiting, weakness.    Exposure to STD       Prior to Admission medications   Medication Sig Start Date End Date Taking? Authorizing Provider  amoxicillin  (AMOXIL ) 500 MG capsule Take 1 capsule (500 mg total) by mouth 3 (three) times daily. 07/12/14   Corey, Evan S, MD  ipratropium (ATROVENT ) 0.06 % nasal spray Place 2 sprays into both nostrils 4 (four) times daily. 07/08/14   Joane Artist RAMAN, MD  naproxen  (NAPROSYN ) 500 MG tablet Take 1 tablet (500 mg total) by mouth 2 (two) times daily. 09/23/24   Horton, Charmaine FALCON, MD  predniSONE  (DELTASONE ) 10 MG tablet Take 3 tablets (30 mg total) by mouth daily. 07/08/14   Joane Artist RAMAN, MD  traMADol  (ULTRAM ) 50 MG tablet Take 1 tablet (50 mg total) by mouth at bedtime as needed (cough). 07/08/14   Joane Artist RAMAN, MD    Allergies: Patient has no known allergies.    Review of Systems  Updated Vital Signs BP (!) 166/110 (BP Location: Left Arm)   Pulse 97   Temp 98.3 F (36.8 C) (Oral)   Resp 17   SpO2 100%   Physical Exam Vitals and nursing note reviewed.  HENT:     Head: Normocephalic and atraumatic.  Eyes:     Pupils: Pupils are equal, round, and reactive to light.  Pulmonary:     Effort:  Pulmonary effort is normal. No respiratory distress.  Musculoskeletal:        General: No signs of injury.     Cervical back: Normal range of motion.  Skin:    General: Skin is dry.  Neurological:     Mental Status: She is alert.  Psychiatric:        Speech: Speech normal.        Behavior: Behavior normal.     (all labs ordered are listed, but only abnormal results are displayed) Labs Reviewed  COMPREHENSIVE METABOLIC PANEL WITH GFR - Abnormal; Notable for the following components:      Result Value   Potassium 3.3 (*)    Glucose, Bld 110 (*)    All other components within normal limits  RAPID HIV SCREEN (HIV 1/2 AB+AG)  PREGNANCY, URINE  SYPHILIS: RPR W/REFLEX TO RPR TITER AND TREPONEMAL ANTIBODIES, TRADITIONAL SCREENING AND DIAGNOSIS ALGORITHM  HEPATITIS PANEL, ACUTE  HEPATITIS C ANTIBODY  HEPATITIS B SURFACE ANTIGEN  GC/CHLAMYDIA PROBE AMP ([place]) NOT AT [facility]    EKG: None  Radiology: No results found.   Procedures   Medications Ordered in the ED  bictegravir-emtricitabine-tenofovir AF (BIKTARVY) 50-200-25 MG Prepack 1 each (has no administration in time range)  Medical Decision Making Amount and/or Complexity of Data Reviewed Labs: ordered.   This patient presents to the ED for concern of HIV exposure, this involves an extensive number of treatment options, and is a complaint that carries with it a high risk of complications and morbidity.    Co morbidities / Chronic conditions that complicate the patient evaluation  Hypertension   Additional history obtained:  Additional history obtained from EMR   Lab Tests:  I Ordered, and personally interpreted labs.  The pertinent results include: Negative pregnancy test, negative rapid HIV screen, unremarkable CMP.  GC chlamydia probe, hepatitis testing, and RPR in progress   Test / Admission - Considered:  No indication for further emergent workup or admission at  this time.  Patient with HIV exposure.  She will be treated with postexposure prophylaxis and will be provided information for follow-up with infectious disease.  Patient stable for discharge at this time.      Final diagnoses:  Exposure to HIV    ED Discharge Orders          Ordered    Ambulatory referral to Infectious Disease       Comments: HIV exposure - evaluated in the ED 11/17/2024   11/17/24 0530               Logan Ubaldo NOVAK, PA-C 11/17/24 0541    Jerral Meth, MD 11/17/24 [phone]

## 2024-11-18 LAB — GC/CHLAMYDIA PROBE AMP (~~LOC~~) NOT AT ARMC
Chlamydia: NEGATIVE
Comment: NEGATIVE
Comment: NORMAL
Neisseria Gonorrhea: NEGATIVE

## 2024-11-18 LAB — HEPATITIS PANEL, ACUTE
HCV Ab: NONREACTIVE
Hep A IgM: NONREACTIVE
Hep B C IgM: NONREACTIVE
Hepatitis B Surface Ag: NONREACTIVE
